# Patient Record
Sex: Male | Born: 1960
Health system: Southern US, Community
[De-identification: ages and names within clinical notes are randomized; demographics above are authoritative.]

## PROBLEM LIST (undated history)

## (undated) ENCOUNTER — Ambulatory Visit (HOSPITAL_COMMUNITY): Discharge status: Absent without leave | Payer: Self-pay

## (undated) DIAGNOSIS — E119 Type 2 diabetes mellitus without complications: Secondary | ICD-10-CM

## (undated) DIAGNOSIS — A539 Syphilis, unspecified: Secondary | ICD-10-CM

## (undated) DIAGNOSIS — I10 Essential (primary) hypertension: Secondary | ICD-10-CM

## (undated) HISTORY — PX: NECK SURGERY: SHX720

---

## 2006-03-11 ENCOUNTER — Emergency Department (HOSPITAL_COMMUNITY): Admission: EM | Admit: 2006-03-11 | Discharge: 2006-03-11 | Payer: Self-pay | Admitting: Emergency Medicine

## 2007-08-15 ENCOUNTER — Inpatient Hospital Stay (HOSPITAL_COMMUNITY): Admission: EM | Admit: 2007-08-15 | Discharge: 2007-08-17 | Payer: Self-pay | Admitting: Emergency Medicine

## 2007-08-15 ENCOUNTER — Encounter: Payer: Self-pay | Admitting: Emergency Medicine

## 2007-11-28 ENCOUNTER — Emergency Department (HOSPITAL_COMMUNITY): Admission: EM | Admit: 2007-11-28 | Discharge: 2007-11-28 | Payer: Self-pay | Admitting: Emergency Medicine

## 2007-12-26 ENCOUNTER — Emergency Department (HOSPITAL_COMMUNITY): Admission: EM | Admit: 2007-12-26 | Discharge: 2007-12-26 | Payer: Self-pay | Admitting: Family Medicine

## 2008-01-27 ENCOUNTER — Emergency Department (HOSPITAL_COMMUNITY): Admission: EM | Admit: 2008-01-27 | Discharge: 2008-01-27 | Payer: Self-pay | Admitting: Emergency Medicine

## 2008-02-03 ENCOUNTER — Encounter: Payer: Self-pay | Admitting: Family Medicine

## 2008-02-03 ENCOUNTER — Ambulatory Visit: Payer: Self-pay | Admitting: Family Medicine

## 2008-02-03 DIAGNOSIS — I10 Essential (primary) hypertension: Secondary | ICD-10-CM | POA: Insufficient documentation

## 2008-02-03 DIAGNOSIS — E669 Obesity, unspecified: Secondary | ICD-10-CM | POA: Insufficient documentation

## 2008-02-03 DIAGNOSIS — E1159 Type 2 diabetes mellitus with other circulatory complications: Secondary | ICD-10-CM

## 2008-02-03 DIAGNOSIS — I152 Hypertension secondary to endocrine disorders: Secondary | ICD-10-CM | POA: Insufficient documentation

## 2008-02-03 HISTORY — DX: Type 2 diabetes mellitus with other circulatory complications: E11.59

## 2008-02-05 LAB — CONVERTED CEMR LAB
BUN: 18 mg/dL (ref 6–23)
Calcium: 9 mg/dL (ref 8.4–10.5)
Chloride: 108 meq/L (ref 96–112)
Creatinine, Ser: 1.18 mg/dL (ref 0.40–1.50)

## 2008-03-23 ENCOUNTER — Ambulatory Visit: Payer: Self-pay | Admitting: Family Medicine

## 2008-04-06 ENCOUNTER — Ambulatory Visit (HOSPITAL_COMMUNITY): Admission: RE | Admit: 2008-04-06 | Discharge: 2008-04-06 | Payer: Self-pay | Admitting: Family Medicine

## 2008-04-06 ENCOUNTER — Ambulatory Visit: Payer: Self-pay | Admitting: Family Medicine

## 2008-04-06 ENCOUNTER — Emergency Department (HOSPITAL_COMMUNITY): Admission: EM | Admit: 2008-04-06 | Discharge: 2008-04-06 | Payer: Self-pay | Admitting: Emergency Medicine

## 2008-04-07 ENCOUNTER — Encounter: Payer: Self-pay | Admitting: Family Medicine

## 2008-04-20 ENCOUNTER — Ambulatory Visit: Payer: Self-pay | Admitting: Family Medicine

## 2008-04-20 ENCOUNTER — Encounter: Payer: Self-pay | Admitting: Family Medicine

## 2008-04-20 LAB — CONVERTED CEMR LAB
Calcium: 9 mg/dL (ref 8.4–10.5)
Chloride: 103 meq/L (ref 96–112)
Creatinine, Ser: 1.06 mg/dL (ref 0.40–1.50)
Sodium: 141 meq/L (ref 135–145)

## 2008-05-20 ENCOUNTER — Ambulatory Visit: Payer: Self-pay | Admitting: Family Medicine

## 2008-05-20 ENCOUNTER — Encounter: Payer: Self-pay | Admitting: Family Medicine

## 2008-07-08 ENCOUNTER — Ambulatory Visit: Payer: Self-pay | Admitting: Family Medicine

## 2008-08-05 ENCOUNTER — Telehealth: Payer: Self-pay | Admitting: Family Medicine

## 2008-08-14 ENCOUNTER — Ambulatory Visit: Payer: Self-pay | Admitting: Family Medicine

## 2008-09-17 ENCOUNTER — Encounter: Payer: Self-pay | Admitting: Family Medicine

## 2008-09-17 ENCOUNTER — Ambulatory Visit: Payer: Self-pay | Admitting: Family Medicine

## 2008-09-18 ENCOUNTER — Telehealth: Payer: Self-pay | Admitting: Family Medicine

## 2008-09-21 ENCOUNTER — Ambulatory Visit: Payer: Self-pay | Admitting: Family Medicine

## 2008-10-22 ENCOUNTER — Ambulatory Visit: Payer: Self-pay | Admitting: Family Medicine

## 2008-12-07 ENCOUNTER — Encounter: Payer: Self-pay | Admitting: Family Medicine

## 2008-12-08 ENCOUNTER — Encounter: Payer: Self-pay | Admitting: Family Medicine

## 2008-12-09 ENCOUNTER — Encounter: Payer: Self-pay | Admitting: Family Medicine

## 2008-12-09 ENCOUNTER — Ambulatory Visit: Payer: Self-pay | Admitting: Family Medicine

## 2008-12-09 DIAGNOSIS — G5620 Lesion of ulnar nerve, unspecified upper limb: Secondary | ICD-10-CM | POA: Insufficient documentation

## 2008-12-22 ENCOUNTER — Ambulatory Visit: Payer: Self-pay | Admitting: Family Medicine

## 2008-12-22 ENCOUNTER — Encounter: Payer: Self-pay | Admitting: Family Medicine

## 2008-12-22 DIAGNOSIS — K219 Gastro-esophageal reflux disease without esophagitis: Secondary | ICD-10-CM | POA: Insufficient documentation

## 2008-12-23 LAB — CONVERTED CEMR LAB
LDL Cholesterol: 105 mg/dL — ABNORMAL HIGH (ref 0–99)
Total CHOL/HDL Ratio: 3.8
Triglycerides: 107 mg/dL (ref <150)

## 2009-01-11 ENCOUNTER — Encounter: Payer: Self-pay | Admitting: *Deleted

## 2009-01-28 ENCOUNTER — Ambulatory Visit: Payer: Self-pay | Admitting: Family Medicine

## 2009-05-27 ENCOUNTER — Ambulatory Visit: Payer: Self-pay | Admitting: Family Medicine

## 2009-05-27 ENCOUNTER — Encounter: Payer: Self-pay | Admitting: Family Medicine

## 2009-07-02 ENCOUNTER — Ambulatory Visit: Payer: Self-pay | Admitting: Family Medicine

## 2009-07-02 DIAGNOSIS — R609 Edema, unspecified: Secondary | ICD-10-CM

## 2009-07-02 HISTORY — DX: Edema, unspecified: R60.9

## 2009-07-21 ENCOUNTER — Ambulatory Visit: Payer: Self-pay | Admitting: Family Medicine

## 2010-01-19 ENCOUNTER — Ambulatory Visit: Payer: Self-pay | Admitting: Family Medicine

## 2010-01-26 ENCOUNTER — Ambulatory Visit: Payer: Self-pay | Admitting: Family Medicine

## 2010-03-08 NOTE — Assessment & Plan Note (Signed)
 Summary: L arm numb x 2 days/[facility] pt   Vital Signs:  Patient profile:   50 year old male Height:      72 inches Weight:      220.31 pounds BMI:     29.99 Temp:     98.3 degrees F Pulse rate:   50 / minute BP sitting:   130 / 87  (right arm)  Vitals Entered By: Arland Morel (December 09, 2008 9:37 AM) CC: left arm numb x 2 days Is Patient Diabetic? No Pain Assessment Patient in pain? no        Primary Care Provider:  Alda Carpen, MD  CC:  left arm numb x 2 days.  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  Gradual onset, worse in the morning, does not radiate to or from neck, present only in hand, medial 2 fingers, and lower forearm.  Bending elbow sometimes worsens symptoms.  No injuries, does not bend elbow or rest on it at work, no episodes of saturday night palsy or ETOH use with sleeping on back of chair.  No numbness in other parts of body, no bowel/bladder incontinence.  Symptoms do not wake him up at night.  Habits & Providers  Alcohol-Tobacco-Diet     Tobacco Status: never  Allergies: 1)  Ace Inhibitors  Past History:  Past Medical History: Last updated: 2008-02-18 HTN  Past Surgical History: Last updated: 02-18-2008 None  Family History: Last updated: 2008/02/18 Father- HTN, deceased  Mother- healthy Brothers- HTN  Social History: Last updated: Feb 18, 2008 Lives alone [place].  Unemployed.  Walk but not intense exercise. Alcohol use-no Drug use-no Tobacco- no Single  Review of Systems       See HPI  Physical Exam  General:  Well-developed,well-nourished,in no acute distress; alert,appropriate and cooperative throughout examination Msk:  Inspection reveals normal musculature, symmetric, no scoliosis.  Spurlings test negative.  Strength 5/5 to all movements of neck, shoulders, elbows, and hand with preserved abd/adduction of fingers.  Sensation with light numbness of left  pinkie and medial aspect of ring finger. Tinels positive over epicondylar groove with paresthesias running down to fingers.  No palpable masses or suluxation of nerve.   Impression & Recommendations:  Problem # 1:  ULNAR NEUROPATHY, ELBOW (ICD-354.2) Assessment New Likely 2/2 stretch of nerve across epicondylar groove during sleep.  No braces available at [facility] to keep elbow at 60deg flexion or less.  Pt will try to keep it straight at night, if no improvement can refer to [facility].  Also printed out ulnar neuropathy stretches from sp med advisor, Rx vit B6 50mg  once daily x 3 months, ibuprofen 800mg  three times a day x 1 week.  Pt advised that this can take up to 2 mos to resolve.  Will RTC if no improvement in that time.  Can consider NCV/EMG at that time, if develops motor weakness should consider ortho referral for nerve mobilization.  Orders: [facility]- Est Level  3 (00786)  Complete Medication List: 1)  Metoprolol  Tartrate 100 Mg Tabs (Metoprolol  tartrate) .... One tablet by mouth two times a day 2)  Anacin 81 Mg Tbec (Aspirin ) .... One tablet by mouth daily 3)  Hydrochlorothiazide  25 Mg Tabs (Hydrochlorothiazide ) .... Take 1 tab by mouth every morning 4)  Amlodipine  Besylate 5 Mg Tabs (Amlodipine  besylate) .... One tablet by mouth daily 5)  Vitamin B-6 50 Mg Tabs (Pyridoxine hcl) .... One tab by mouth daily 6)  Ibuprofen 800  Mg Tabs (Ibuprofen) .... One tab by mouth three times a day x 1 week.  Patient Instructions: 1)  Great to meet you today, 2)  You have ulnar neuropathy.  Do the attached exercises DAILY, also take the vitamin B6 supplement daily, and ibuprofen 800mg  3x a day for a week.  This condition could take 2-3 months to get better.  Come back to see us  if no better in that time.  Also try to keep your elbow straight at night and avoid leaning on the left elbow. 3)  -Dr. ONEIDA. Prescriptions: IBUPROFEN 800 MG TABS (IBUPROFEN) One tab by mouth three times a day x 1 week.  #21 x 0   Entered  and Authorized by:   Debby Petties MD   Signed by:   Debby Petties MD on 12/09/2008   Method used:   Faxed to ...       [facility] (retail)       [address] [place], [place]  [address]       Ph: [phone]       Fax: [phone]   RxID:   [id number] VITAMIN B-6 50 MG TABS (PYRIDOXINE HCL) One tab by mouth daily  #90 x 0   Entered and Authorized by:   Debby Petties MD   Signed by:   Debby Petties MD on 12/09/2008   Method used:   Faxed to ...       [facility] (retail)       [address] [place], [place]  [address]       Ph: [phone]       Fax: [phone]   RxID:   [id number]

## 2010-03-08 NOTE — Assessment & Plan Note (Signed)
Summary: f/u HTN- worsened   Vital Signs:  Patient profile:   50 year old male Height:      72 inches Weight:      239.2 pounds BMI:     32.56 Temp:     98.0 degrees F oral Pulse rate:   62 / minute BP sitting:   157 / 98  (left arm) Cuff size:   large  Vitals Entered By: Gladstone Pih (July 21, 2009 9:02 AM) CC: swollen ankles Is Patient Diabetic? No Pain Assessment Patient in pain? no        Primary Care Provider:  Marisue Ivan, MD  CC:  swollen ankles.  History of Present Illness: 50yo M here for f/u HTN  HTN: Pt states that he was still taking the amlodipine despite instructions to stop it and reports continued swelling of lower extremities.  Has not taken the HCTZ in 1 month which he failed to report at the last visit. BP at home ranging b/w 120-160s/70-100s.  Was well controlled at last visit.  No dizziness, HA, CP, palpitations.  Habits & Providers  Alcohol-Tobacco-Diet     Tobacco Status: never  Current Medications (verified): 1)  Metoprolol Tartrate 100 Mg Tabs (Metoprolol Tartrate) .... One Tablet By Mouth Two Times A Day 2)  Anacin 81 Mg Tbec (Aspirin) .... One Tablet By Mouth Daily 3)  Hydrochlorothiazide 25 Mg  Tabs (Hydrochlorothiazide) .... Take 1 Tab By Mouth Every Morning  Allergies (verified): 1)  Ace Inhibitors 2)  Amlodipine Besylate  Review of Systems      See HPI  Physical Exam  General:  VS Reviewed. Well appearing, NAD.  Lungs:  Normal respiratory effort, chest expands symmetrically. Lungs are clear to auscultation, no crackles or wheezes. Heart:  Normal rate and regular rhythm. S1 and S2 normal without gallop, murmur, click, rub or other extra sounds. Extremities:  1+ edema of ankles and feet Neurologic:  no focal deficits   Impression & Recommendations:  Problem # 1:  HYPERTENSION (ICD-401.9) Assessment Deteriorated  Not at goal today. Poor compliance and understanding of instructions. I took away his amlodipine today.   Advised him to pick up the HCTZ.  Discussed importance of physical activity and dec salt intake. He acknowledge understanding.  He is to keep a log of his BP and f/u with Dr. Gwendolyn Grant in 2-3 months.  His updated medication list for this problem includes:    Metoprolol Tartrate 100 Mg Tabs (Metoprolol tartrate) ..... One tablet by mouth two times a day    Hydrochlorothiazide 25 Mg Tabs (Hydrochlorothiazide) .Marland Kitchen... Take 1 tab by mouth every morning  Orders: [facility]- Est Level  3 (95621)  Problem # 2:  EDEMA (ICD-782.3) Assessment: Deteriorated Concern that it is related to the amlodipine. Should improve with stopping the amlodipine and restarting HCTZ  His updated medication list for this problem includes:    Hydrochlorothiazide 25 Mg Tabs (Hydrochlorothiazide) .Marland Kitchen... Take 1 tab by mouth every morning  Complete Medication List: 1)  Metoprolol Tartrate 100 Mg Tabs (Metoprolol tartrate) .... One tablet by mouth two times a day 2)  Anacin 81 Mg Tbec (Aspirin) .... One tablet by mouth daily 3)  Hydrochlorothiazide 25 Mg Tabs (Hydrochlorothiazide) .... Take 1 tab by mouth every morning  Patient Instructions: 1)  Follow up with your new physician in the next 2-3 months to reassess your blood pressure and medications. 2)  If you have chest pain, palpitations, or passing out symptoms, call us.   Prescriptions: HYDROCHLOROTHIAZIDE 25 MG  TABS (HYDROCHLOROTHIAZIDE) Take 1 tab by mouth every morning  #90 x 1   Entered and Authorized by:   Marisue Ivan  MD   Signed by:   Marisue Ivan  MD on 07/21/2009   Method used:   Electronically to        [facility] [phone]* (retail)       [address]       [place], [place]  [address]       Ph: [phone]       Fax: [phone]   RxID:   [id number]

## 2010-03-08 NOTE — Assessment & Plan Note (Signed)
Summary: periphera edema-> d/c'd amlodipine, started losartan   Vital Signs:  Patient profile:   50 year old male Weight:      240.3 pounds Temp:     98.4 degrees F oral Pulse rate:   67 / minute Pulse rhythm:   regular BP sitting:   166 / 126  (right arm) Cuff size:   large  Vitals Entered By: Loralee Pacas CMA (May 27, 2009 9:10 AM)  Serial Vital Signs/Assessments:  Time      Position  BP       Pulse  Resp  Temp     By 9:38 AM             148/102                        Loralee Pacas CMA   Primary Care Provider:  Marisue Ivan, MD  CC:  swelling LE.  History of Present Illness: 50yo M c/o LE swelling  LE swelling: x 1 month.  Admits to foods with high salt content.  No SOB.    HTN: Reports LE swelling.  Not checking it regularly.  Was well controlled at last visit.  No dizziness, CP, palpitations.    Preventative: Not taking ASA.    Current Medications (verified): 1)  Metoprolol Tartrate 100 Mg Tabs (Metoprolol Tartrate) .... One Tablet By Mouth Two Times A Day 2)  Anacin 81 Mg Tbec (Aspirin) .... One Tablet By Mouth Daily 3)  Hydrochlorothiazide 25 Mg  Tabs (Hydrochlorothiazide) .... Take 1 Tab By Mouth Every Morning 4)  Nitrostat 0.4 Mg Subl (Nitroglycerin) .Marland Kitchen.. 1 Underneath The Tongue As Needed With Chest Pain Call Me If You Use This and It Gets Better 5)  Losartan Potassium 50 Mg Tabs (Losartan Potassium) .Marland Kitchen.. 1 Tab By Mouth Daily  Allergies: 1)  Ace Inhibitors 2)  Amlodipine Besylate  Review of Systems      See HPI  Physical Exam  General:  VS Reviewed. Well appearing, NAD.  Neck:  no JVD Lungs:  Normal respiratory effort, chest expands symmetrically. Lungs are clear to auscultation, no crackles or wheezes. Heart:  Normal rate and regular rhythm. S1 and S2 normal without gallop, murmur, click, rub or other extra sounds. Abdomen:  Soft, NT, ND, no HSM, active BS  Extremities:  1+ edema LE Neurologic:  no focal deficits   Impression &  Recommendations:  Problem # 1:  HYPERTENSION (ICD-401.9) Assessment Deteriorated Not at goal (<140/90). Likely adverse rxn to amlodipine -> peripheral edema. Plan to stop the amlodipine, start losartan 50mg  and f/u in 1 month to reassess. If he can tolerate the amlodipine, plan to change to combo med w/ losartan and HCTZ. Will check BMET to assess renal fxn and K.    The following medications were removed from the medication list:    Amlodipine Besylate 10 Mg Tabs (Amlodipine besylate) .Marland Kitchen... 1 tablet by mouth daily for high blood pressure His updated medication list for this problem includes:    Metoprolol Tartrate 100 Mg Tabs (Metoprolol tartrate) ..... One tablet by mouth two times a day    Hydrochlorothiazide 25 Mg Tabs (Hydrochlorothiazide) .Marland Kitchen... Take 1 tab by mouth every morning    Losartan Potassium 50 Mg Tabs (Losartan potassium) .Marland Kitchen... 1 tab by mouth daily  Orders: [facility]Basic Metabolic Panel [phone]) [facility]- Est Level  3 (95621)  Complete Medication List: 1)  Metoprolol Tartrate 100 Mg Tabs (Metoprolol tartrate) .... One tablet by mouth two times a day  2)  Anacin 81 Mg Tbec (Aspirin) .... One tablet by mouth daily 3)  Hydrochlorothiazide 25 Mg Tabs (Hydrochlorothiazide) .... Take 1 tab by mouth every morning 4)  Nitrostat 0.4 Mg Subl (Nitroglycerin) .Marland Kitchen.. 1 underneath the tongue as needed with chest pain call me if you use this and it gets better 5)  Losartan Potassium 50 Mg Tabs (Losartan potassium) .Marland Kitchen.. 1 tab by mouth daily  Patient Instructions: 1)  Please schedule a follow-up appointment in 1 month.  2)  I started you on a new medication called Losartan for your blood pressure. 3)  Stop taking the amlodipine. Prescriptions: HYDROCHLOROTHIAZIDE 25 MG  TABS (HYDROCHLOROTHIAZIDE) Take 1 tab by mouth every morning  #30 x 1   Entered and Authorized by:   Marisue Ivan  MD   Signed by:   Marisue Ivan  MD on 05/27/2009   Method used:   Electronically to        [facility] [phone]* (retail)       [address]       [place], [place]  [address]       Ph: [phone]       Fax: [phone]   RxID:   [id number] METOPROLOL TARTRATE 100 MG TABS (METOPROLOL TARTRATE) one tablet by mouth two times a day  #180 x 1   Entered and Authorized by:   Marisue Ivan  MD   Signed by:   Marisue Ivan  MD on 05/27/2009   Method used:   Electronically to        [facility] [address] [phone]* (retail)       [address]       [place], [place]  [address]       Ph: [phone]       Fax: [phone]   RxID:   [id number] LOSARTAN POTASSIUM 50 MG TABS (LOSARTAN POTASSIUM) 1 tab by mouth daily  #30 x 1   Entered and Authorized by:   Marisue Ivan  MD   Signed by:   Marisue Ivan  MD on 05/27/2009   Method used:   Electronically to        [facility] [phone]* (retail)       [address]       [place], [place]  [address]       Ph: [phone]       Fax: [phone]   RxID:   [id number]    Prevention & Chronic Care Immunizations   Influenza vaccine: refused  (02/03/2008)   Influenza vaccine due: 02/02/2009    Tetanus booster: 02/03/2008: given   Tetanus booster due: 02/02/2018    Pneumococcal vaccine: Not documented  Other Screening   PSA: Not documented   Smoking status: never  (12/22/2008)  Lipids   Total Cholesterol: 171  (12/22/2008)   LDL: 105  (12/22/2008)   LDL Direct: Not documented   HDL: 45  (12/22/2008)   Triglycerides: 107  (12/22/2008)  Hypertension   Last Blood Pressure: 166 / 126  (05/27/2009)   Serum creatinine: 1.06  (04/20/2008)   BMP action: Ordered   Serum potassium 4.2  (04/20/2008)    Hypertension flowsheet reviewed?: Yes   Progress toward BP goal: Deteriorated  Self-Management Support :   Personal Goals (by the next clinic visit) :      Personal blood pressure goal: 140/90  (10/22/2008)   Patient will work on the following items until the next clinic visit to reach self-care  goals:  Medications and monitoring: take my medicines every day, check my blood pressure, bring all of my medications to every visit  (05/27/2009)     Eating: drink diet soda or water instead of juice or soda, eat more vegetables, use fresh or frozen vegetables, eat foods that are low in salt, eat baked foods instead of fried foods, eat fruit for snacks and desserts, limit or avoid alcohol  (05/27/2009)     Activity: take a 30 minute walk every day  (12/22/2008)    Hypertension self-management support: BP self-monitoring log, Written self-care plan, Education handout  (05/27/2009)   Hypertension self-care plan printed.   Hypertension education handout printed

## 2010-03-08 NOTE — Miscellaneous (Signed)
Summary: walk in  Clinical Lists Changes came in due to swollen legs & feet. has started a new job 2 weeks ago that is mostly sedentary. Non-pitting, skin is tight on lower legs. I did not have him remove shoes. BP is 170-104 p. 65. has been out of one of his HTN meds x 4 days. needs refill on another. has a few days left. c/o intermittant eye pain. symptoms started about 1 month ago. he was concerned about CVA. reviewed the signs with him & advised calling the drug store 3-4 days before he runs out of meds to avoid skipping doses. placed in work in.[address] [address] RN  May 27, 2009 9:10 AM  Pt to be placed in my clinic schedule today......Marland KitchenMarisue Ivan, MD

## 2010-03-08 NOTE — Assessment & Plan Note (Signed)
Summary: f/u htn and edema   Vital Signs:  Patient profile:   50 year old male Height:      72 inches Weight:      240.7 pounds BMI:     32.76 Temp:     98.4 degrees F oral Pulse rate:   64 / minute BP sitting:   128 / 82  (left arm) Cuff size:   large  Vitals Entered By: Gladstone Pih (Jul 02, 2009 10:56 AM) CC: f/u of HTN Is Patient Diabetic? No Pain Assessment Patient in pain? no      Comments just getting over being sick, did not start on New HTN medication due to cost   Primary Care Provider:  Marisue Ivan, MD  CC:  f/u of HTN.  History of Present Illness: 50yo M here for f/u HTN  HTN: No adverse effects from medication but was unable to pick up the losartan due to cost.  States that he is walking and jogging more.   Checking it occasionally ranging in the 120s-150s.  Was close to goal at last visit.  No dizziness, HA, CP, palpitations.  Still has LE edema worse in the left ankle (overall better than prior visit).   Habits & Providers  Alcohol-Tobacco-Diet     Tobacco Status: never  Current Medications (verified): 1)  Metoprolol Tartrate 100 Mg Tabs (Metoprolol Tartrate) .... One Tablet By Mouth Two Times A Day 2)  Anacin 81 Mg Tbec (Aspirin) .... One Tablet By Mouth Daily 3)  Hydrochlorothiazide 25 Mg  Tabs (Hydrochlorothiazide) .... Take 1 Tab By Mouth Every Morning 4)  Nitrostat 0.4 Mg Subl (Nitroglycerin) .Marland Kitchen.. 1 Underneath The Tongue As Needed With Chest Pain Call Me If You Use This and It Gets Better  Allergies (verified): 1)  Ace Inhibitors 2)  Amlodipine Besylate  Review of Systems      See HPI  Physical Exam  General:  VS Reviewed. Well appearing, NAD.  Lungs:  Normal respiratory effort, chest expands symmetrically. Lungs are clear to auscultation, no crackles or wheezes. Heart:  Normal rate and regular rhythm. S1 and S2 normal without gallop, murmur, click, rub or other extra sounds. Extremities:  1+ edema LE 2+ edema of Left ankle; no  ttp; full ROM   Impression & Recommendations:  Problem # 1:  HYPERTENSION (ICD-401.9) Assessment Improved  At goal (<140/90). Seems to be improved with increased physical activity.  Will d/c the losartan b/c he cannot afford it.  He is to keep a log of his BP now that he has a BP cuff/monitor.  The following medications were removed from the medication list:    Losartan Potassium 50 Mg Tabs (Losartan potassium) .Marland Kitchen... 1 tab by mouth daily His updated medication list for this problem includes:    Metoprolol Tartrate 100 Mg Tabs (Metoprolol tartrate) ..... One tablet by mouth two times a day    Hydrochlorothiazide 25 Mg Tabs (Hydrochlorothiazide) .Marland Kitchen... Take 1 tab by mouth every morning  Orders: [facility]- Est Level  3 (27253)  Problem # 2:  EDEMA (ICD-782.3) Assessment: Improved  Slightly improved LE edema since stopping the amlodipine. Provided compression stockings. f/u in 3 weeks per pt request.  His updated medication list for this problem includes:    Hydrochlorothiazide 25 Mg Tabs (Hydrochlorothiazide) .Marland Kitchen... Take 1 tab by mouth every morning  Orders: [facility]- Est Level  3 (66440)  Complete Medication List: 1)  Metoprolol Tartrate 100 Mg Tabs (Metoprolol tartrate) .... One tablet by mouth two times a day 2)  Anacin 81 Mg Tbec (Aspirin) .... One tablet by mouth daily 3)  Hydrochlorothiazide 25 Mg Tabs (Hydrochlorothiazide) .... Take 1 tab by mouth every morning 4)  Nitrostat 0.4 Mg Subl (Nitroglycerin) .Marland Kitchen.. 1 underneath the tongue as needed with chest pain call me if you use this and it gets better  Patient Instructions: 1)  Schedule f/u appt in 3 weeks. 2)  Keep a log of your blood pressure. 3)  I'm giving you a prescription for compression stockings to help with the swelling. 4)  Don't worry about the losartan.   Prevention & Chronic Care Immunizations   Influenza vaccine: refused  (02/03/2008)   Influenza vaccine due: 02/02/2009    Tetanus booster: 02/03/2008: given    Tetanus booster due: 02/02/2018    Pneumococcal vaccine: Not documented  Other Screening   PSA: Not documented   Smoking status: never  (07/02/2009)  Lipids   Total Cholesterol: 171  (12/22/2008)   LDL: 105  (12/22/2008)   LDL Direct: Not documented   HDL: 45  (12/22/2008)   Triglycerides: 107  (12/22/2008)  Hypertension   Last Blood Pressure: 128 / 82  (07/02/2009)   Serum creatinine: 1.06  (04/20/2008)   BMP action: Ordered   Serum potassium 4.2  (04/20/2008)    Hypertension flowsheet reviewed?: Yes   Progress toward BP goal: At goal  Self-Management Support :   Personal Goals (by the next clinic visit) :      Personal blood pressure goal: 140/90  (10/22/2008)   Hypertension self-management support: BP self-monitoring log, Written self-care plan, Education handout  (05/27/2009)

## 2010-03-10 NOTE — Assessment & Plan Note (Signed)
Summary: F/U  KH   Vital Signs:  Patient profile:   50 year old male Height:      72 inches Weight:      254 pounds BMI:     34.57 Temp:     98.7 degrees F oral Pulse rate:   71 / minute BP sitting:   160 / 94  (left arm) Cuff size:   large  Vitals Entered By: Garen Grams LPN (January 26, 2010 10:17 AM) CC: f/u bp Is Patient Diabetic? No Pain Assessment Patient in pain? no        Primary Care Provider:  Bobby Rumpf  MD  CC:  f/u bp.  History of Present Illness: 1) HTN: Last seen on 12/14. Had run out of BP meds 5 days prior to being seen. Was here to have medications refilled - had been taking his medications inconsistently.  Pressure was noted to be 180's / 120's; patient also reported mild to moderate headache at that time (headache has since resolved with Tylenol on that day). Restarted on medications, also given prescription for metoprolol - patient reportst hat he did not pick this up because he was told it would be $50 at [facility] (though it is on the $4 list at [facility]). Was checking BPs occasionally at home - noted sytolics to be in 190-200 range at times and that BP was consistently worse with salty or fried foods. Sedentary.   2) Obesity: Weight 254 lbs. Peak weight since being seen here. Sedentary. Would eat fried foods / fast food almost every day - has cut back since his last visit with me.      Denies chest pain, dyspnea, urinary changes, vision change, nausea, emesis, neck stiffness, focal neurological signs, headache  Med rec as below except for metoprolol which he has not picked up.   Habits & Providers  Alcohol-Tobacco-Diet     Tobacco Status: never  Allergies: 1)  Ace Inhibitors 2)  Amlodipine Besylate  Family History: Reviewed history from 02/03/2008 and no changes required. Father- HTN, deceased  Mother- healthy Brothers- HTN  Social History: Alcohol use-no Drug use-no Tobacco- no Single  Physical Exam  Neck:  no JVD  Lungs:   normal respiratory effort, no intercostal retractions, no accessory muscle use, and normal breath sounds.   Heart:  normal rate, regular rhythm, no murmur, no gallop, no JVD, and PMI normal.   Pulses:  2+ radials  Extremities:  no edema    Impression & Recommendations:  Problem # 1:  HYPERTENSION (ICD-401.9) Assessment Unchanged  Not at goal 140/90. Advised to start taking metorpolol as prescribed. Advised regarding exercise and DASH - counseled on this for 25 minutes.  His updated medication list for this problem includes:    Metoprolol Tartrate 100 Mg Tabs (Metoprolol tartrate) ..... One tablet by mouth two times a day    Hydrochlorothiazide 25 Mg Tabs (Hydrochlorothiazide) .Marland Kitchen... Take 1 tab by mouth every morning    Losartan Potassium 50 Mg Tabs (Losartan potassium) ..... One tab by mouth qday  BP today: 160/94 Prior BP: 183/124 (01/19/2010)  Prior 10 Yr Risk Heart Disease: 7 % (02/03/2008)  Labs Reviewed: K+: 4.2 (04/20/2008) Creat: : 1.06 (04/20/2008)   Chol: 171 (12/22/2008)   HDL: 45 (12/22/2008)   LDL: 105 (12/22/2008)   TG: 107 (12/22/2008)  Orders: [facility]- Est  Level 4 (16109)  Problem # 2:  OBESITY (ICD-278.00) Assessment: Deteriorated  Counseled on DASH diet, exercise for 25 minutes. Patient agrees to start walking 30 minutes  a day 3 times a week and to cut back on fast food to once a week. Will follow.   Orders: [facility]- Est  Level 4 (16109)  Complete Medication List: 1)  Metoprolol Tartrate 100 Mg Tabs (Metoprolol tartrate) .... One tablet by mouth two times a day 2)  Anacin 81 Mg Tbec (Aspirin) .... One tablet by mouth daily 3)  Hydrochlorothiazide 25 Mg Tabs (Hydrochlorothiazide) .... Take 1 tab by mouth every morning 4)  Losartan Potassium 50 Mg Tabs (Losartan potassium) .... One tab by mouth qday   Orders Added: 1)  [facility]- Est  Level 4 [60454]

## 2010-03-10 NOTE — Assessment & Plan Note (Signed)
Summary: elevated BP and headache/ls  patient walks in office today wanting refill on HCTZ and metoprolol.  last office visit was 07/2009.  states he has been without meds for 5 days. states he has a severe headache and patient appears in much distress .  advised he will need to be seen . Dr. Wallene Huh will see him now.  Theresia Lo RN  January 19, 2010 9:13 AM   Vital Signs:  Patient profile:   50 year old male Height:      72 inches Temp:     98.4 degrees F Pulse rate:   83 / minute BP sitting:   183 / 124  (left arm) Cuff size:   large  Vitals Entered By: Garen Grams LPN (January 19, 2010 8:58 AM) CC: migraine, elevated BP Is Patient Diabetic? No Pain Assessment Patient in pain? yes     Location: headache   Primary Care Provider:  Bobby Rumpf  MD  CC:  migraine and elevated BP.  History of Present Illness: 1) HTN: Ran out of BP meds 5 days ago. Here to have medications refilled. Does not always take his medications everyday. Checks BPs occasionally at home - notes sytolics to be in 190-200 range at times. BP is worse when he eats salty or fried foods - he reports that he can eat a whole bag of potato chips in one sitting. Does not exercise. Denies chest pain, dyspnea, urinary changes. Reports headache today - usually has headache if blood pressure is high. Headache is mild to moderate, worse with movement, occipital. Has not taken anything for headache. Denies vision change, nausea, emesis, neck stiffness, focal neurological signs.   Med rec as below (ran out of meds 5 days ago)   Habits & Providers  Alcohol-Tobacco-Diet     Tobacco Status: never  Current Medications (verified): 1)  Metoprolol Tartrate 100 Mg Tabs (Metoprolol Tartrate) .... One Tablet By Mouth Two Times A Day 2)  Anacin 81 Mg Tbec (Aspirin) .... One Tablet By Mouth Daily 3)  Hydrochlorothiazide 25 Mg  Tabs (Hydrochlorothiazide) .... Take 1 Tab By Mouth Every Morning 4)  Losartan Potassium 50 Mg Tabs  (Losartan Potassium) .... One Tab By Mouth Qday  Allergies (verified): 1)  Ace Inhibitors 2)  Amlodipine Besylate  Physical Exam  General:  obese, NAD, vitals reviewed, hypertensive  Eyes:  pupils equal, round and reactive to light, extraoccular movements intact, no papilledema on limited funduscopy  Neck:  no JVD  Lungs:  normal respiratory effort, no intercostal retractions, no accessory muscle use, and normal breath sounds.   Heart:  normal rate, regular rhythm, no murmur, no gallop, no JVD, and PMI normal.   Pulses:  2+ radials  Extremities:  no edema  Neurologic:  alert & oriented X3, cranial nerves II-XII intact, strength normal in all extremities, gait normal, and DTRs symmetrical and normal.     Impression & Recommendations:  Problem # 1:  HYPERTENSION (ICD-401.9) Assessment Deteriorated  Restart medications. No focal neurological signs, no signs of increased ICP on exam w/ headache - unlikely hypertensive emergency. Tylenol for headache.  No other symptoms of end organ damage. Will restart BP meds today, add losartan, have patient follow up in one week with me. Patient requests having me as his new PCP (has not seen Dr. Gwendolyn Grant before). I have arranged this. Advised to restrict salt. Advised on DASH diet. CHECK BMET for Cr at next visit, also check TSH, lipid panel.   His updated medication list for  this problem includes:    Metoprolol Tartrate 100 Mg Tabs (Metoprolol tartrate) ..... One tablet by mouth two times a day    Hydrochlorothiazide 25 Mg Tabs (Hydrochlorothiazide) .Marland Kitchen... Take 1 tab by mouth every morning    Losartan Potassium 50 Mg Tabs (Losartan potassium) ..... One tab by mouth qday  BP today: 183/124 Prior BP: 157/98 (07/21/2009)  Prior 10 Yr Risk Heart Disease: 7 % (02/03/2008)  Labs Reviewed: K+: 4.2 (04/20/2008) Creat: : 1.06 (04/20/2008)   Chol: 171 (12/22/2008)   HDL: 45 (12/22/2008)   LDL: 105 (12/22/2008)   TG: 107 (12/22/2008)  Orders: [facility]- Est  Level  3 (16109)  Complete Medication List: 1)  Metoprolol Tartrate 100 Mg Tabs (Metoprolol tartrate) .... One tablet by mouth two times a day 2)  Anacin 81 Mg Tbec (Aspirin) .... One tablet by mouth daily 3)  Hydrochlorothiazide 25 Mg Tabs (Hydrochlorothiazide) .... Take 1 tab by mouth every morning 4)  Losartan Potassium 50 Mg Tabs (Losartan potassium) .... One tab by mouth qday  Patient Instructions: 1)  It was great to see you today!  2)  Take the new medication for blood pressure if you are able to afford it (Losartan) 3)  Take your other blood pressure medications as before. 4)  Stay away from fast food, salty foods and do not add salt to foods you cook 5)  Eat fresh or frozen fruits and vegetables when you can (try to get at least 5-6 servings per day. 6)  Take Tylenol for headache - if you notice your headache gets worse or you have chest pain or weakness / numbness anywhere give [place] a call.  7)  Come back to see me in one week - let them know up front that you are requesting a change of physician to me.  Prescriptions: METOPROLOL TARTRATE 100 MG TABS (METOPROLOL TARTRATE) one tablet by mouth two times a day  #180 x 1   Entered and Authorized by:   Bobby Rumpf  MD   Signed by:   Bobby Rumpf  MD on 01/19/2010   Method used:   Electronically to        [facility] [phone]* (retail)       [address]       [place], [place]  [address]       Ph: [phone]       Fax: [phone]   RxID:   [id number] HYDROCHLOROTHIAZIDE 25 MG  TABS (HYDROCHLOROTHIAZIDE) Take 1 tab by mouth every morning  #90 x 1   Entered and Authorized by:   Bobby Rumpf  MD   Signed by:   Bobby Rumpf  MD on 01/19/2010   Method used:   Electronically to        [facility] [phone]* (retail)       [address]       [place], [place]  [address]       Ph: [phone]       Fax: [phone]   RxID:   [id number] LOSARTAN POTASSIUM 50 MG TABS (LOSARTAN POTASSIUM) one tab by mouth qday  #30 x  0   Entered and Authorized by:   Bobby Rumpf  MD   Signed by:   Bobby Rumpf  MD on 01/19/2010   Method used:   Print then Give to Patient   RxID:   [id number]    Orders Added: 1)  [facility]- Est Level  3 [27062]

## 2010-05-31 ENCOUNTER — Ambulatory Visit (INDEPENDENT_AMBULATORY_CARE_PROVIDER_SITE_OTHER): Payer: Self-pay | Admitting: Family Medicine

## 2010-05-31 ENCOUNTER — Encounter: Payer: Self-pay | Admitting: Family Medicine

## 2010-05-31 DIAGNOSIS — R7301 Impaired fasting glucose: Secondary | ICD-10-CM

## 2010-05-31 DIAGNOSIS — E669 Obesity, unspecified: Secondary | ICD-10-CM

## 2010-05-31 DIAGNOSIS — I10 Essential (primary) hypertension: Secondary | ICD-10-CM

## 2010-05-31 HISTORY — DX: Impaired fasting glucose: R73.01

## 2010-05-31 MED ORDER — HYDROCHLOROTHIAZIDE 25 MG PO TABS: 25.0000 mg | ORAL_TABLET | Freq: Every day | ORAL | Status: DC

## 2010-05-31 MED ORDER — METOPROLOL TARTRATE 100 MG PO TABS: 100.0000 mg | ORAL_TABLET | Freq: Two times a day (BID) | ORAL | Status: DC

## 2010-05-31 NOTE — Progress Notes (Signed)
  Subjective:    Patient ID: Nicholas Case, male    DOB: Apr 21, 1960, 50 y.o.   MRN: [id number]  HPI  1) HTN: Last seen on 01/26/10. Has been out of his blood pressure medications for at least one month - has been unable to afford due to the fact that he was laid off several months ago and can only find part time work. Blood pressure at home has been as high as 229 / 145 - he has been in the 200's / 100's range since he ran out of his medications. Reports mild headache today relieved by ibuprofen 800 mg - which he has been taking intermittently for headache. Has been monitoring salt intake (though looking at percentages instead of total mg sodium) and has started walking daily for exercise. Has not eaten fast food in several months. Reports headache as above, along with some blurry vision. Denies chest pain, dyspnea, LE edema.   2) Obesity: Weight 254 lbs at peak in December 2011. Weight now down to 239 lbs. Increased activity with daily walking. Has eliminated fast foods.   3) Elevated fasting glucose: Noted on fasting labs from 2010. Reports some increased thirst and increased urination, occasional blurry vision as above.  Reviewed pertinent past medical history as above.   Review of Systems As per HPI     Objective:   Physical Exam  Constitutional: He is oriented to person, place, and time. He appears well-developed and well-nourished. No distress.  Eyes: EOM are normal. Pupils are equal, round, and reactive to light.  Fundoscopic exam:      The right eye shows no arteriolar narrowing, no AV nicking, no exudate, no hemorrhage and no papilledema.       The left eye shows no arteriolar narrowing, no AV nicking, no exudate, no hemorrhage and no papilledema.  Neck: No JVD present. Carotid bruit is not present. No mass and no thyromegaly present.  Cardiovascular: Normal rate, regular rhythm, S1 normal, S2 normal, normal heart sounds, intact distal pulses and normal pulses.  PMI is not displaced.    Pulmonary/Chest: Effort normal and breath sounds normal. No respiratory distress.  Neurological: He is alert and oriented to person, place, and time. He has normal strength. No cranial nerve deficit.          Assessment & Plan:

## 2010-05-31 NOTE — Patient Instructions (Addendum)
Follow up in two weeks  Go to [facility] to get assistance with your medications Keep your SODIUM below 1500 mg daily (add it up over the course of a day) Continue to walk for exercise If you notice chest pain, shortness of breath vision problems, come back in sooner.

## 2010-06-01 NOTE — Assessment & Plan Note (Addendum)
Uncontrolled secondary to non-adherence to medications due to inability to afford. Information for Medication Assistance Program given. Patient states that he will be able to get a friend to help him pay for his medications for this month. Refilled medications. Advised regarding DASH diet - handout given. Follow up in two weeks. No signs of hypertensive emergency on history or exam. Reviewed red flags. Encouraged continued exercise and salt restriction. (1500 mg daily)

## 2010-06-01 NOTE — Assessment & Plan Note (Signed)
Refused labs. Unable to assess as a result. Follow up in two weeks. Advised regarding exercise and dietary modification - total appointment time = 25 minutes.

## 2010-06-01 NOTE — Assessment & Plan Note (Signed)
Advised regarding continued exercise and dietary modification. Congratulated on weight loss. Follow up two weeks. Total appointment time = 25 minutes.

## 2010-06-15 ENCOUNTER — Ambulatory Visit (INDEPENDENT_AMBULATORY_CARE_PROVIDER_SITE_OTHER): Payer: Self-pay | Admitting: Family Medicine

## 2010-06-15 ENCOUNTER — Encounter: Payer: Self-pay | Admitting: Family Medicine

## 2010-06-15 ENCOUNTER — Other Ambulatory Visit: Payer: Self-pay | Admitting: *Deleted

## 2010-06-15 DIAGNOSIS — I10 Essential (primary) hypertension: Secondary | ICD-10-CM

## 2010-06-15 DIAGNOSIS — R7301 Impaired fasting glucose: Secondary | ICD-10-CM

## 2010-06-15 DIAGNOSIS — E669 Obesity, unspecified: Secondary | ICD-10-CM

## 2010-06-15 LAB — COMPREHENSIVE METABOLIC PANEL
ALT: 24 U/L (ref 0–53)
Albumin: 4.2 g/dL (ref 3.5–5.2)
BUN: 13 mg/dL (ref 6–23)
CO2: 22 mEq/L (ref 19–32)
Creat: 1.01 mg/dL (ref 0.40–1.50)
Potassium: 3.9 mEq/L (ref 3.5–5.3)
Sodium: 141 mEq/L (ref 135–145)
Total Bilirubin: 0.3 mg/dL (ref 0.3–1.2)
Total Protein: 7.2 g/dL (ref 6.0–8.3)

## 2010-06-15 LAB — POCT GLYCOSYLATED HEMOGLOBIN (HGB A1C): Hemoglobin A1C: 6.1

## 2010-06-15 LAB — LIPID PANEL
LDL Cholesterol: 126 mg/dL — ABNORMAL HIGH (ref 0–99)
Triglycerides: 161 mg/dL — ABNORMAL HIGH (ref <150)
VLDL: 32 mg/dL (ref 0–40)

## 2010-06-15 MED ORDER — LOSARTAN POTASSIUM 50 MG PO TABS: 50.0000 mg | ORAL_TABLET | Freq: Every day | ORAL | Status: DC

## 2010-06-15 NOTE — Assessment & Plan Note (Signed)
Still uncontrolled but much improved on medications. Will add Cozaar as he can afford this today, advised to continue with  Medication Assistance Program given.Advised regarding DASH diet - handout given. Follow up 6 weeks. No signs of hypertensive emergency on history or exam. Reviewed red flags. Encouraged continued exercise and salt restriction. (1500 mg daily)

## 2010-06-15 NOTE — Progress Notes (Signed)
  Subjective:    Patient ID: Nicholas Case, male    DOB: July 27, 1960, 50 y.o.   MRN: [id number]  HPI  1) HTN: Last seen on 05/31/10. Has been taking his HCTZ and metoprolol since then after having been out of his blood pressure medications for at least one month - had been unable to afford due to the fact that he was laid off several months ago and can only find part time work. Blood pressure at home had been as high as 229 / 145 - he has been in the 200's / 100's range since he ran out of his medications. Reports mild headache today - had previously been taking ibuprofen 800 mg for headaches but has since stopped. Has been monitoring salt intake but has stopped walking daily for exercise. Has not eaten fast food in several months. Reports headache as above, along with some blurry vision. Denies chest pain, dyspnea, LE edema.   2) Obesity: Weight 254 lbs at peak in December 2011. Weight now 240 lbs; unfortunately he has stopped walking for exercise. Has eliminated fast foods.   3) Elevated fasting glucose: Noted on fasting labs from 2010. Reports some increased thirst and increased urination, occasional blurry vision as above. Refused labs at last visit.  Pertinent past medical history reviewed  Review of Systems As per HPI     Objective:   Physical Exam Constitutional: He is oriented to person, place, and time. He appears well-developed and well-nourished. No distress.  Eyes: EOM are normal. Pupils are equal, round, and reactive to light.  Fundoscopic exam:      The right eye shows no arteriolar narrowing, no AV nicking, no exudate, no hemorrhage and no papilledema.       The left eye shows no arteriolar narrowing, no AV nicking, no exudate, no hemorrhage and no papilledema.  Neck: No JVD present. Carotid bruit is not present. No mass and no thyromegaly present.  Cardiovascular: Normal rate, regular rhythm, S1 normal, S2 normal, normal heart sounds, intact distal pulses and normal pulses.  PMI  is not displaced.   Pulmonary/Chest: Effort normal and breath sounds normal. No respiratory distress.  Neurological: He is alert and oriented to person, place, and time. He has normal strength. No cranial nerve deficit.        Assessment & Plan:

## 2010-06-15 NOTE — Assessment & Plan Note (Signed)
Check A1C today. Follow up in 6 weeks. Advised regarding exercise and dietary modification - total appointment time = 25 minutes.

## 2010-06-15 NOTE — Assessment & Plan Note (Signed)
Advised regarding continued exercise and dietary modification. Congratulated on maintaining weight. Follow up 6 weeks. Total appointment time = 25 minutes.

## 2010-06-15 NOTE — Patient Instructions (Addendum)
Follow up with me in late June.  Take your Cozaar as directed to help blood pressure Try to increase your walking I will let you know your lab results.   - Dr. Wallene Huh

## 2010-06-17 ENCOUNTER — Encounter: Payer: Self-pay | Admitting: Family Medicine

## 2010-06-21 NOTE — H&P (Signed)
NAME:  RIGHTEOUS, CLAIBORNE               ACCOUNT NO.:  [id number]   MEDICAL RECORD NO.:  [id number]          PATIENT TYPE:  INP   LOCATION:  0106                         FACILITY:  [facility]   PHYSICIAN:  Mobolaji B. Bakare, M.D.DATE OF BIRTH:  1960/10/13   DATE OF ADMISSION:  08/15/2007  DATE OF DISCHARGE:                              HISTORY & PHYSICAL   PRIMARY CARE PHYSICIAN:  Unassigned.   CHIEF COMPLAINT:  High blood pressure.   HISTORY OF PRESENTING COMPLAINT:  Mr. Danford is a pleasant 50 year old  African American male with known history of high blood pressure.  He  went to [facility] today because of concern about  sexually transmitted disease.  He was given some medications for these.  In addition, the patient was noted to have high blood pressure.  Hence,  he was sent over to the emergency room for further evaluation.  Initial  blood pressure upon arrival was 196/122 with a heart rate of 83.  He had  multiple blood pressure checks here in the emergency room.  The maximum  in the emergency room after admission was 222/161.  The patient has  received labetalol 20 mg x3 doses.  He is currently on nitroprusside  infusion.  Blood pressure is 168/180.   He had complained of headaches upon arrival and has been having  headaches for a couple of days.  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  He was advised to follow up with a physician  at that time, but he has consistently refused because of the high cost  of health care which he cannot afford.  He has made several insinuations  to leave the emergency room against medical advice today, but the  patient has been persuaded to stay.  He is really concerned about the  cost of this hospitalization.   He denies blurred vision.  No chest pain, dyspnea on exertion.  He tries  to exercise every day.   REVIEW OF SYSTEMS:  No cough,  orthopnea, PND, lower extremity edema.  No  abdominal pain, fevers, chills.  No dysuria or straining at micturition.   PAST MEDICAL AND SURGICAL HISTORY:  Hypertension for which he has not  sought any medical treatment.   MEDICATIONS:  None except for 3 pills that were given to him today for  the concern about sexually transmitted disease.  Names unknown.   ALLERGIES:  No known drug allergies.   FAMILY HISTORY:  Father passed away at a very young age of 73.  He had  hypertension.  He had bilateral lower extremity amputation.  The patient  cannot really give much of the details.  Mother is well.  He has one  brother with hypertension.  No known family history of coronary artery  disease.   SOCIAL HISTORY:  He does not smoke cigarettes.  He does not drink  alcohol.  He smokes pot.  He works at [employer] as a Production designer, theatre/television/film.   INITIAL VITALS:  Temperature 98.3, blood pressure 196/122, heart rate of  83, respiratory rate 18, O2 saturation 99%.  On examination, the patient is awake, alert, oriented to time, place and  person.  The patient is on the overweight side.  Normocephalic, atraumatic.  Pupils equal, round and reactive to light.  Extraocular muscle movement intact.  No elevated JVD.  No carotid bruit.  Mucous membranes moist.  No oral thrush.  LUNGS:  Clear clinically to auscultation.  CARDIOVASCULAR SYSTEM:  S1-S2 regular.  No murmur or gallop.  ABDOMEN:  Not distended, soft, nontender.  Bowel sounds present.  EXTREMITIES:  No pedal edema or calf tenderness.  Dorsalis pedis pulses  palpable bilaterally.  CENTRAL NERVOUS SYSTEM:  No focal neurological deficit.   INITIAL LABORATORY DATA:  Sodium 141, potassium 4.4, chloride 107, BUN  13, creatinine 1.1, glucose 91, calcium 1.15,  bicarb 24.  Hemoglobin  14.3, hematocrit 42, white cell 6.5, platelets 193.  Head CT scan showed  no acute intracranial abnormality.  EKG pending.   ASSESSMENT AND PLAN:  Mr. Delair is a 50 year old  African American male  presenting with hypertensive urgency.  He will be admitted to step-down  unit.  Will continue with nitroprusside infusion with aim to keep  systolic blood pressure between to 140 and 150, diastolic at 90-100 with  a MAP of 100-110.  Will also start lisinopril tablets 20 mg daily,  hydrochlorothiazide 25 mg daily.  Check fasting lipid profile,  urinalysis and EKG.  Will ask financial counselor to see and social  worker to evaluate for setting up outpatient follow-up.  The patient  will ultimately need generic prescription available at [facility] or  [facility] for $4.  Will offer dietary counseling.  He would need an  outpatient physician as well prior to discharge.  Will place on low-salt  diet.      Mobolaji B. Corky Downs, M.D.  Electronically Signed     MBB/MEDQ  D:  08/15/2007  T:  08/15/2007  Job:  [id number]

## 2010-08-02 ENCOUNTER — Ambulatory Visit: Payer: Self-pay | Admitting: Family Medicine

## 2010-11-03 LAB — POCT I-STAT, CHEM 8
BUN: 13
Glucose, Bld: 91
HCT: 42
Potassium: 4.1
Sodium: 141

## 2010-11-03 LAB — URINALYSIS, ROUTINE W REFLEX MICROSCOPIC
Bilirubin Urine: NEGATIVE
Hgb urine dipstick: NEGATIVE
Protein, ur: NEGATIVE
Urobilinogen, UA: 0.2

## 2010-11-03 LAB — LIPID PANEL
Cholesterol: 132
Total CHOL/HDL Ratio: 3.6
Triglycerides: 109

## 2010-11-03 LAB — CBC
MCV: 85.1
RDW: 13.8

## 2010-11-03 LAB — URINE DRUGS OF ABUSE SCREEN W ALC, ROUTINE (REF LAB)
Amphetamine Screen, Ur: NEGATIVE
Benzodiazepines.: NEGATIVE
Cocaine Metabolites: NEGATIVE
Opiate Screen, Urine: NEGATIVE
Propoxyphene: NEGATIVE

## 2010-11-03 LAB — URINE MICROSCOPIC-ADD ON

## 2016-11-02 ENCOUNTER — Emergency Department (HOSPITAL_COMMUNITY): Payer: No Typology Code available for payment source

## 2016-11-02 ENCOUNTER — Encounter (HOSPITAL_COMMUNITY): Payer: Self-pay | Admitting: Emergency Medicine

## 2016-11-02 ENCOUNTER — Emergency Department (HOSPITAL_COMMUNITY)
Admission: EM | Admit: 2016-11-02 | Discharge: 2016-11-02 | Disposition: A | Discharge status: Home / Self Care | Payer: No Typology Code available for payment source | Attending: Emergency Medicine | Admitting: Emergency Medicine

## 2016-11-02 DIAGNOSIS — M25512 Pain in left shoulder: Secondary | ICD-10-CM | POA: Diagnosis not present

## 2016-11-02 DIAGNOSIS — Y9389 Activity, other specified: Secondary | ICD-10-CM | POA: Insufficient documentation

## 2016-11-02 DIAGNOSIS — Y92410 Unspecified street and highway as the place of occurrence of the external cause: Secondary | ICD-10-CM | POA: Insufficient documentation

## 2016-11-02 DIAGNOSIS — Z7982 Long term (current) use of aspirin: Secondary | ICD-10-CM | POA: Insufficient documentation

## 2016-11-02 DIAGNOSIS — R51 Headache: Secondary | ICD-10-CM | POA: Diagnosis not present

## 2016-11-02 DIAGNOSIS — R519 Headache, unspecified: Secondary | ICD-10-CM

## 2016-11-02 DIAGNOSIS — S199XXA Unspecified injury of neck, initial encounter: Secondary | ICD-10-CM | POA: Diagnosis present

## 2016-11-02 DIAGNOSIS — Y998 Other external cause status: Secondary | ICD-10-CM | POA: Insufficient documentation

## 2016-11-02 DIAGNOSIS — M549 Dorsalgia, unspecified: Secondary | ICD-10-CM | POA: Diagnosis not present

## 2016-11-02 DIAGNOSIS — I10 Essential (primary) hypertension: Secondary | ICD-10-CM | POA: Insufficient documentation

## 2016-11-02 DIAGNOSIS — S161XXA Strain of muscle, fascia and tendon at neck level, initial encounter: Secondary | ICD-10-CM

## 2016-11-02 DIAGNOSIS — Z79899 Other long term (current) drug therapy: Secondary | ICD-10-CM | POA: Diagnosis not present

## 2016-11-02 HISTORY — DX: Essential (primary) hypertension: I10

## 2016-11-02 MED ORDER — METHOCARBAMOL 500 MG PO TABS: 1000.0000 mg | ORAL_TABLET | Freq: Four times a day (QID) | ORAL | 0 refills | Status: DC

## 2016-11-02 MED ORDER — NAPROXEN 500 MG PO TABS: 500.0000 mg | ORAL_TABLET | Freq: Two times a day (BID) | ORAL | 0 refills | Status: DC

## 2016-11-02 MED ORDER — METHOCARBAMOL 500 MG PO TABS
1000.0000 mg | ORAL_TABLET | Freq: Once | ORAL | Status: AC
  Administered 2016-11-02: 1000 mg via ORAL
  Filled 2016-11-02: qty 2
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
## 2016-11-02 NOTE — ED Notes (Signed)
Patient transported to CT 
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
## 2017-06-05 ENCOUNTER — Ambulatory Visit (HOSPITAL_COMMUNITY)
Admission: EM | Admit: 2017-06-05 | Discharge: 2017-06-05 | Disposition: A | Discharge status: Home / Self Care | Payer: BLUE CROSS/BLUE SHIELD | Attending: Family Medicine | Admitting: Family Medicine

## 2017-06-05 ENCOUNTER — Encounter (HOSPITAL_COMMUNITY): Payer: Self-pay

## 2017-06-05 ENCOUNTER — Other Ambulatory Visit: Payer: Self-pay

## 2017-06-05 DIAGNOSIS — Z760 Encounter for issue of repeat prescription: Secondary | ICD-10-CM

## 2017-06-05 DIAGNOSIS — I10 Essential (primary) hypertension: Secondary | ICD-10-CM | POA: Diagnosis not present

## 2017-06-05 MED ORDER — CARVEDILOL 12.5 MG PO TABS: 12.5000 mg | ORAL_TABLET | Freq: Two times a day (BID) | ORAL | 0 refills | Status: DC

## 2017-06-05 MED ORDER — CLONIDINE HCL 0.3 MG PO TABS: 0.3000 mg | ORAL_TABLET | Freq: Two times a day (BID) | ORAL | 0 refills | Status: DC

## 2017-06-05 MED ORDER — AMLODIPINE BESYLATE 5 MG PO TABS: 5.0000 mg | ORAL_TABLET | Freq: Every day | ORAL | 0 refills | Status: DC

## 2017-06-05 MED ORDER — HYDROCHLOROTHIAZIDE 25 MG PO TABS: 25.0000 mg | ORAL_TABLET | Freq: Every day | ORAL | 0 refills | Status: DC

## 2017-06-05 MED FILL — cloNIDine HCL 0.3 MG TABS: 0.3 | 30 days supply | Qty: 60 | Fill #0

## 2017-06-05 MED FILL — AMLODIPINE BESYLATE 5 MG TA: 5 | 30 days supply | Qty: 30 | Fill #0

## 2017-06-05 MED FILL — HYDROCHLOROTHIAZIDE 25 MG T: 25 | 30 days supply | Qty: 30 | Fill #0

## 2017-06-05 MED FILL — CARVEDILOL 12.5 MG TABLET: 12.5 | 30 days supply | Qty: 60 | Fill #0

## 2017-06-05 NOTE — ED Triage Notes (Signed)
Pt presents needing his blood pressure medication refilled.

## 2017-06-05 NOTE — ED Provider Notes (Signed)
[facility]    CSN: [id number] Arrival date & time: 06/05/17  4098     History   Chief Complaint Chief Complaint  Patient presents with  . Med Refill    HPI Nicholas Case is a 57 y.o. male.   57 year old male with history of hypertension comes in for medication refill.  He has been seeing Dr. Tami Lin, who recently left his practice, and was told to follow-up here.  He has a PCP appointment in May with his new PCP.  Has been taking amlodipine, HCTZ, Catapres, carvedilol for the past 3 years without problems.  He has been taking medications as directed.  Denies chest pain, shortness of breath, palpitations.  Denies problems with medication.  States last blood work was done a month ago with normal kidney function.      Past Medical History:  Diagnosis Date  . Hypertension     Patient Active Problem List   Diagnosis Date Noted  . Elevated fasting glucose 05/31/2010  . EDEMA 07/02/2009  . GERD 12/22/2008  . ULNAR NEUROPATHY, ELBOW 12/09/2008  . HYPERTENSION 02/03/2008  . OBESITY 02/03/2008    Past Surgical History:  Procedure Laterality Date  . NECK SURGERY         Home Medications    Prior to Admission medications   Medication Sig Start Date End Date Taking? Authorizing Provider  aspirin 81 MG EC tablet Take 81 mg by mouth daily.     Yes [provider]  methocarbamol (ROBAXIN) 500 MG tablet Take 2 tablets (1,000 mg total) by mouth 4 (four) times daily. 11/02/16  Yes Renne Crigler, [name]  naproxen (NAPROSYN) 500 MG tablet Take 1 tablet (500 mg total) by mouth 2 (two) times daily. 11/02/16  Yes Renne Crigler, [name]  amLODipine (NORVASC) 5 MG tablet Take 1 tablet (5 mg total) by mouth daily. 06/05/17   Cathie Hoops, Oluchi Pucci V, [name]  carvedilol (COREG) 12.5 MG tablet Take 1 tablet (12.5 mg total) by mouth 2 (two) times daily with a meal. 06/05/17 07/05/17  Cathie Hoops, Adelia Baptista V, [name]  cloNIDine (CATAPRES) 0.3 MG tablet Take 1 tablet (0.3 mg total) by mouth 2 (two) times daily.  06/05/17 07/05/17  Cathie Hoops, Kelon Easom V, [name]  hydrochlorothiazide (HYDRODIURIL) 25 MG tablet Take 1 tablet (25 mg total) by mouth daily. 06/05/17   Belinda Fisher, [name]    Family History No family history on file.  Social History Social History   Tobacco Use  . Smoking status: Never Smoker  . Smokeless tobacco: Current User  Substance Use Topics  . Alcohol use: No  . Drug use: No     Allergies   Ace inhibitors   Review of Systems Review of Systems  Reason unable to perform ROS: See HPI as above.     Physical Exam Triage Vital Signs ED Triage Vitals  Enc Vitals Group     BP 06/05/17 1019 122/87     Pulse Rate 06/05/17 1019 66     Resp 06/05/17 1019 16     Temp 06/05/17 1019 99.1 F (37.3 C)     Temp Source 06/05/17 1019 Oral     SpO2 06/05/17 1019 99 %     Weight 06/05/17 1025 240 lb (108.9 kg)     Height --      Head Circumference --      Peak Flow --      Pain Score 06/05/17 1025 0     Pain Loc --      Pain Edu? --  Excl. in GC? --    No data found.  Updated Vital Signs BP 122/87 (BP Location: Left Arm)   Pulse 66   Temp 99.1 F (37.3 C) (Oral)   Resp 16   Wt 240 lb (108.9 kg)   SpO2 99%   BMI 32.55 kg/m   Physical Exam  Constitutional: He is oriented to person, place, and time. He appears well-developed and well-nourished. No distress.  HENT:  Head: Normocephalic and atraumatic.  Eyes: Pupils are equal, round, and reactive to light. Conjunctivae are normal.  Cardiovascular: Normal rate, regular rhythm and normal heart sounds. Exam reveals no gallop and no friction rub.  No murmur heard. Pulmonary/Chest: Effort normal and breath sounds normal. No accessory muscle usage or stridor. No respiratory distress. He has no decreased breath sounds. He has no wheezes. He has no rhonchi. He has no rales.  Neurological: He is alert and oriented to person, place, and time.  Skin: He is not diaphoretic.     UC Treatments / Results  Labs (all labs ordered are listed,  but only abnormal results are displayed) Labs Reviewed - No data to display  EKG None  Radiology No results found.  Procedures Procedures (including critical care time)  Medications Ordered in UC Medications - No data to display  Initial Impression / Assessment and Plan / UC Course  I have reviewed the triage vital signs and the nursing notes.  Pertinent labs & imaging results that were available during my care of the patient were reviewed by me and considered in my medical decision making (see chart for details).    Medication refilled for 30 days.  Patient to follow-up with new PCP as scheduled for further evaluation and management needed.  Return precautions given.  Patient expresses understanding and agrees to plan.  Final Clinical Impressions(s) / UC Diagnoses   Final diagnoses:  Medication refill     Discharge Instructions     Medication refilled for 30 days. Follow up with new PCP as scheduled for further management needed.    ED Prescriptions    Medication Sig Dispense Auth. Provider   hydrochlorothiazide (HYDRODIURIL) 25 MG tablet Take 1 tablet (25 mg total) by mouth daily. 30 tablet Gavyn Zoss V, [name]   amLODipine (NORVASC) 5 MG tablet Take 1 tablet (5 mg total) by mouth daily. 30 tablet Lois Ostrom V, [name]   carvedilol (COREG) 12.5 MG tablet Take 1 tablet (12.5 mg total) by mouth 2 (two) times daily with a meal. 60 tablet Musab Wingard V, [name]   cloNIDine (CATAPRES) 0.3 MG tablet Take 1 tablet (0.3 mg total) by mouth 2 (two) times daily. 60 tablet Threasa Alpha, [place] 06/05/17 1050

## 2017-06-05 NOTE — Discharge Instructions (Signed)
Medication refilled for 30 days. Follow up with new PCP as scheduled for further management needed.

## 2017-06-20 ENCOUNTER — Ambulatory Visit (INDEPENDENT_AMBULATORY_CARE_PROVIDER_SITE_OTHER): Payer: Self-pay | Admitting: Nurse Practitioner

## 2017-07-10 ENCOUNTER — Encounter (HOSPITAL_COMMUNITY): Payer: Self-pay | Admitting: Emergency Medicine

## 2017-07-10 ENCOUNTER — Ambulatory Visit (HOSPITAL_COMMUNITY)
Admission: EM | Admit: 2017-07-10 | Discharge: 2017-07-10 | Disposition: A | Discharge status: Home / Self Care | Payer: BLUE CROSS/BLUE SHIELD | Attending: Physician Assistant | Admitting: Physician Assistant

## 2017-07-10 DIAGNOSIS — I10 Essential (primary) hypertension: Secondary | ICD-10-CM | POA: Diagnosis not present

## 2017-07-10 DIAGNOSIS — Z760 Encounter for issue of repeat prescription: Secondary | ICD-10-CM

## 2017-07-10 MED ORDER — HYDROCHLOROTHIAZIDE 25 MG PO TABS: 25.0000 mg | ORAL_TABLET | Freq: Every day | ORAL | 0 refills | Status: DC

## 2017-07-10 MED ORDER — AMLODIPINE BESYLATE 5 MG PO TABS: 5.0000 mg | ORAL_TABLET | Freq: Every day | ORAL | 0 refills | Status: DC

## 2017-07-10 MED ORDER — CARVEDILOL 12.5 MG PO TABS: 12.5000 mg | ORAL_TABLET | Freq: Two times a day (BID) | ORAL | 0 refills | Status: DC

## 2017-07-10 MED ORDER — CLONIDINE HCL 0.3 MG PO TABS: 0.3000 mg | ORAL_TABLET | Freq: Two times a day (BID) | ORAL | 0 refills | Status: DC

## 2017-07-10 MED FILL — HYDROCHLOROTHIAZIDE 25 MG T: 25 | 30 days supply | Qty: 30 | Fill #0

## 2017-07-10 MED FILL — AMLODIPINE BESYLATE 5 MG TA: 5 | 30 days supply | Qty: 30 | Fill #0

## 2017-07-10 MED FILL — CARVEDILOL 12.5 MG TABLET: 12.5 | 30 days supply | Qty: 60 | Fill #0

## 2017-07-10 MED FILL — cloNIDine HCL 0.3 MG TABS: 0.3 | 30 days supply | Qty: 60 | Fill #0

## 2017-07-10 NOTE — ED Provider Notes (Signed)
07/10/2017 10:30 AM   DOB: 05/12/1960 / MRN: [id number]  SUBJECTIVE:  Nicholas Case is a 57 y.o. male presenting for med refill.  Per Dr. Loma BostonLauenstein's last note:  "57 year old male with history of hypertension comes in for medication refill.  He has been seeing Dr. Tami LinJedge, who recently left his practice, and was told to follow-up here.  He has a PCP appointment in May with his new PCP.  Has been taking amlodipine, HCTZ, Catapres, carvedilol for the past 3 years without problems.  He has been taking medications as directed.  Denies chest pain, shortness of breath, palpitations.  Denies problems with medication.  States last blood work was done a month ago with normal kidney function."  He notes no changes.  He feels well.  He is allergic to ace inhibitors.   He  has a past medical history of Hypertension.    He  reports that he has never smoked. He uses smokeless tobacco. He reports that he does not drink alcohol or use drugs. He  has no sexual activity history on file. The patient  has a past surgical history that includes Neck surgery.  His family history is not on file.  Review of Systems  Constitutional: Negative for chills, diaphoresis and fever.  Eyes: Negative.   Respiratory: Negative for cough, hemoptysis, sputum production, shortness of breath and wheezing.   Cardiovascular: Negative for chest pain, orthopnea and leg swelling.  Gastrointestinal: Negative for nausea.  Skin: Negative for rash.  Neurological: Negative for dizziness, sensory change, speech change, focal weakness and headaches.    OBJECTIVE:  BP 129/85 (BP Location: Left Arm)   Pulse 65   Temp 97.7 F (36.5 C) (Oral)   Resp 18   SpO2 95%   Wt Readings from Last 3 Encounters:  06/05/17 240 lb (108.9 kg)  06/15/10 (!) 240 lb (108.9 kg)  05/31/10 (!) 239 lb 1.6 oz (108.5 kg)   Temp Readings from Last 3 Encounters:  07/10/17 97.7 F (36.5 C) (Oral)  06/05/17 99.1 F (37.3 C) (Oral)  11/02/16 98.3 F (36.8 C)  (Oral)   BP Readings from Last 3 Encounters:  07/10/17 129/85  06/05/17 122/87  11/02/16 (!) 155/99   Pulse Readings from Last 3 Encounters:  07/10/17 65  06/05/17 66  11/02/16 (!) 54    Physical Exam  Constitutional: He is oriented to person, place, and time. He appears well-developed. He does not appear ill.  Eyes: Pupils are equal, round, and reactive to light. Conjunctivae and EOM are normal.  Cardiovascular: Normal rate, regular rhythm, S1 normal, S2 normal, normal heart sounds, intact distal pulses and normal pulses. Exam reveals no gallop and no friction rub.  No murmur heard. Pulmonary/Chest: Effort normal. No stridor. No respiratory distress. He has no wheezes. He has no rales.  Abdominal: He exhibits no distension.  Musculoskeletal: Normal range of motion. He exhibits no edema, tenderness or deformity.  Neurological: He is alert and oriented to person, place, and time. He displays normal reflexes. No cranial nerve deficit or sensory deficit. He exhibits normal muscle tone. Coordination normal.  Skin: Skin is warm and dry. He is not diaphoretic.  Psychiatric: He has a normal mood and affect.  Nursing note and vitals reviewed.   No results found for this or any previous visit (from the past 72 hour(s)).  No results found.  ASSESSMENT AND PLAN:   Encounter for medication refill  Well-controlled hypertension: I have refilled per patient's request. He has also requested for a call  from the [facility] to help him get established.     Discharge Instructions     Meds are waiting the the pharmacy.  Come back if you need [place]. Hopefully you can get in with a PCP quickly.         The patient is advised to call or return to clinic if he does not see an improvement in symptoms, or to seek the care of the closest emergency department if he worsens with the above plan.   Deliah Boston, MHS, [name] 07/10/2017 10:30 AM   Ofilia Neas, [name] 07/10/17 1030

## 2017-07-10 NOTE — ED Triage Notes (Signed)
Pt here for blood pressure medication refill

## 2017-07-10 NOTE — Discharge Instructions (Signed)
Meds are waiting the the pharmacy.  Come back if you need us. Hopefully you can get in with a PCP quickly.

## 2017-08-13 ENCOUNTER — Ambulatory Visit (HOSPITAL_COMMUNITY)
Admission: EM | Admit: 2017-08-13 | Discharge: 2017-08-13 | Disposition: A | Discharge status: Home / Self Care | Payer: BLUE CROSS/BLUE SHIELD | Attending: Family Medicine | Admitting: Family Medicine

## 2017-08-13 ENCOUNTER — Encounter (HOSPITAL_COMMUNITY): Payer: Self-pay | Admitting: Emergency Medicine

## 2017-08-13 DIAGNOSIS — Z760 Encounter for issue of repeat prescription: Secondary | ICD-10-CM | POA: Diagnosis not present

## 2017-08-13 DIAGNOSIS — I10 Essential (primary) hypertension: Secondary | ICD-10-CM

## 2017-08-13 LAB — POCT I-STAT, CHEM 8
BUN: 14 mg/dL (ref 6–20)
CALCIUM ION: 1.17 mmol/L (ref 1.15–1.40)
CHLORIDE: 104 mmol/L (ref 98–111)
Creatinine, Ser: 1 mg/dL (ref 0.61–1.24)
GLUCOSE: 125 mg/dL — AB (ref 70–99)
HCT: 46 % (ref 39.0–52.0)
Hemoglobin: 15.6 g/dL (ref 13.0–17.0)
POTASSIUM: 4 mmol/L (ref 3.5–5.1)
Sodium: 142 mmol/L (ref 135–145)
TCO2: 25 mmol/L (ref 22–32)

## 2017-08-13 MED ORDER — HYDROCHLOROTHIAZIDE 25 MG PO TABS: 25.0000 mg | ORAL_TABLET | Freq: Every day | ORAL | 0 refills | Status: DC

## 2017-08-13 MED ORDER — CARVEDILOL 12.5 MG PO TABS: 12.5000 mg | ORAL_TABLET | Freq: Two times a day (BID) | ORAL | 0 refills | Status: DC

## 2017-08-13 MED ORDER — AMLODIPINE BESYLATE 5 MG PO TABS: 5.0000 mg | ORAL_TABLET | Freq: Every day | ORAL | 0 refills | Status: DC

## 2017-08-13 MED ORDER — CLONIDINE HCL 0.3 MG PO TABS: 0.3000 mg | ORAL_TABLET | Freq: Two times a day (BID) | ORAL | 0 refills | Status: DC

## 2017-08-13 MED FILL — AMLODIPINE BESYLATE 5 MG TA: 5 | 30 days supply | Qty: 30 | Fill #0

## 2017-08-13 MED FILL — CARVEDILOL 12.5 MG TABLET: 12.5 | 30 days supply | Qty: 60 | Fill #0

## 2017-08-13 MED FILL — cloNIDine HCL 0.3 MG TABS: 0.3 | 30 days supply | Qty: 60 | Fill #0

## 2017-08-13 MED FILL — HYDROCHLOROTHIAZIDE 25 MG T: 25 | 30 days supply | Qty: 30 | Fill #0

## 2017-08-13 NOTE — Discharge Instructions (Signed)
Please refill your blood pressure medications and take as prescribed. Please establish with a primary care provider for continued management and monitoring of your blood pressure and medications. If develop chest pain, weakness, nausea, sweating, arm or jaw pain please go to the Er.

## 2017-08-13 NOTE — ED Provider Notes (Signed)
[facility]    CSN: [id number] Arrival date & time: 08/13/17  [id number]     History   Chief Complaint Chief Complaint  Patient presents with  . Medication Refill    HPI Nicholas Case is a 57 y.o. male.   Nicholas Case presents for request for his blood pressure medications to be refilled. He has been out of medications since 08/09/17. He does not currently have a PCP and states has not been able to get in with a provider. Occasionally has dizziness, denies currently. No chest pain , no shortness of breath . Has occasional headache, currently a mild headache, rates it 4/10. Has not taken any medications for headache. Occasional leg swelling if he stands for long periods of time, no current swelling. No other medications he is taking regularly. Has been seen for refills of BP medications- coreg, amlodipine, hctz, clonidine- for the past three months.     ROS per HPI.      Past Medical History:  Diagnosis Date  . Hypertension     Patient Active Problem List   Diagnosis Date Noted  . Elevated fasting glucose 05/31/2010  . EDEMA 07/02/2009  . GERD 12/22/2008  . ULNAR NEUROPATHY, ELBOW 12/09/2008  . HYPERTENSION 02/03/2008  . OBESITY 02/03/2008    Past Surgical History:  Procedure Laterality Date  . NECK SURGERY         Home Medications    Prior to Admission medications   Medication Sig Start Date End Date Taking? Authorizing Provider  aspirin 81 MG EC tablet Take 81 mg by mouth daily.     Yes [provider]  amLODipine (NORVASC) 5 MG tablet Take 1 tablet (5 mg total) by mouth daily. 08/13/17   Georgetta HaberBurky, Lemon Sternberg B, NP  carvedilol (COREG) 12.5 MG tablet Take 1 tablet (12.5 mg total) by mouth 2 (two) times daily with a meal. 08/13/17 09/12/17  Linus MakoBurky, Caroleann Casler B, NP  cloNIDine (CATAPRES) 0.3 MG tablet Take 1 tablet (0.3 mg total) by mouth 2 (two) times daily. 08/13/17 09/12/17  Georgetta HaberBurky, Mendy Chou B, NP  hydrochlorothiazide (HYDRODIURIL) 25 MG tablet Take 1 tablet (25 mg total)  by mouth daily. 08/13/17   Georgetta HaberBurky, Benjermin Korber B, NP    Family History No family history on file.  Social History Social History   Tobacco Use  . Smoking status: Never Smoker  . Smokeless tobacco: Current User  Substance Use Topics  . Alcohol use: No  . Drug use: No     Allergies   Ace inhibitors   Review of Systems Review of Systems   Physical Exam Triage Vital Signs ED Triage Vitals  Enc Vitals Group     BP 08/13/17 0952 (!) 180/113     Pulse Rate 08/13/17 0952 (!) 53     Resp 08/13/17 0952 16     Temp 08/13/17 0952 98.4 F (36.9 C)     Temp Source 08/13/17 0952 Oral     SpO2 08/13/17 0952 99 %     Weight 08/13/17 0951 240 lb (108.9 kg)     Height --      Head Circumference --      Peak Flow --      Pain Score 08/13/17 0951 0     Pain Loc --      Pain Edu? --      Excl. in GC? --    No data found.  Updated Vital Signs BP (!) 180/113   Pulse (!) 53   Temp 98.4 F (36.9  C) (Oral)   Resp 16   Wt 240 lb (108.9 kg)   SpO2 99%   BMI 32.55 kg/m    Physical Exam  Constitutional: He is oriented to person, place, and time. He appears well-developed and well-nourished.  HENT:  Head: Normocephalic and atraumatic.  Eyes: Pupils are equal, round, and reactive to light. EOM are normal.  Cardiovascular: Regular rhythm and normal heart sounds.  Pulmonary/Chest: Effort normal and breath sounds normal.  Musculoskeletal: Normal range of motion.  No lower extremity edema visible or palpable   Neurological: He is alert and oriented to person, place, and time. No cranial nerve deficit.  Skin: Skin is warm and dry.     UC Treatments / Results  Labs (all labs ordered are listed, but only abnormal results are displayed) Labs Reviewed - No data to display  EKG None  Radiology No results found.  Procedures Procedures (including critical care time)  Medications Ordered in UC Medications - No data to display  Initial Impression / Assessment and Plan / UC Course   I have reviewed the triage vital signs and the nursing notes.  Pertinent labs & imaging results that were available during my care of the patient were reviewed by me and considered in my medical decision making (see chart for details).     Noted HTN in clinic today. Has been out of medications for the past 4 days. Without indications of crisis at this time. No chest pain  Or shortness of breath. Per chart review has been on same medications for some time now. Chem 8 collected today, reassuring. Discussed significance of finding a PCP for management of BP and medications. Return precautions provided. Patient verbalized understanding and agreeable to plan.  Ambulatory out of clinic without difficulty.    Final Clinical Impressions(s) / UC Diagnoses   Final diagnoses:  Encounter for medication refill  Hypertension, unspecified type     Discharge Instructions     Please refill your blood pressure medications and take as prescribed. Please establish with a primary care provider for continued management and monitoring of your blood pressure and medications. If develop chest pain, weakness, nausea, sweating, arm or jaw pain please go to the Er.     ED Prescriptions    Medication Sig Dispense Auth. Provider   amLODipine (NORVASC) 5 MG tablet Take 1 tablet (5 mg total) by mouth daily. 30 tablet Linus Mako B, NP   carvedilol (COREG) 12.5 MG tablet Take 1 tablet (12.5 mg total) by mouth 2 (two) times daily with a meal. 60 tablet Vicky Mccanless, Dorene Grebe B, NP   cloNIDine (CATAPRES) 0.3 MG tablet Take 1 tablet (0.3 mg total) by mouth 2 (two) times daily. 60 tablet Linus Mako B, NP   hydrochlorothiazide (HYDRODIURIL) 25 MG tablet Take 1 tablet (25 mg total) by mouth daily. 30 tablet Georgetta Haber, NP     Controlled Substance Prescriptions [place] Controlled Substance Registry consulted? Not Applicable   Georgetta Haber, NP 08/13/17 1028

## 2017-08-13 NOTE — ED Triage Notes (Signed)
PT has been out of his meds for 3 days. PT takes 4 cardiac meds.

## 2017-09-11 ENCOUNTER — Other Ambulatory Visit: Payer: Self-pay

## 2017-09-11 ENCOUNTER — Ambulatory Visit (INDEPENDENT_AMBULATORY_CARE_PROVIDER_SITE_OTHER): Payer: BLUE CROSS/BLUE SHIELD | Admitting: Physician Assistant

## 2017-09-11 ENCOUNTER — Encounter (INDEPENDENT_AMBULATORY_CARE_PROVIDER_SITE_OTHER): Payer: Self-pay | Admitting: Physician Assistant

## 2017-09-11 VITALS — BP 153/109 | HR 73 | Temp 98.0°F | Ht 72.0 in | Wt 228.4 lb

## 2017-09-11 DIAGNOSIS — Z114 Encounter for screening for human immunodeficiency virus [HIV]: Secondary | ICD-10-CM | POA: Diagnosis not present

## 2017-09-11 DIAGNOSIS — R7303 Prediabetes: Secondary | ICD-10-CM

## 2017-09-11 DIAGNOSIS — R739 Hyperglycemia, unspecified: Secondary | ICD-10-CM

## 2017-09-11 DIAGNOSIS — I10 Essential (primary) hypertension: Secondary | ICD-10-CM | POA: Diagnosis not present

## 2017-09-11 DIAGNOSIS — Z1211 Encounter for screening for malignant neoplasm of colon: Secondary | ICD-10-CM

## 2017-09-11 DIAGNOSIS — Z1159 Encounter for screening for other viral diseases: Secondary | ICD-10-CM

## 2017-09-11 LAB — POCT GLYCOSYLATED HEMOGLOBIN (HGB A1C): Hemoglobin A1C: 6.3 % — AB (ref 4.0–5.6)

## 2017-09-11 MED ORDER — METFORMIN HCL ER 500 MG PO TB24: 500.0000 mg | ORAL_TABLET | Freq: Every day | ORAL | 1 refills | Status: DC

## 2017-09-11 MED ORDER — AMLODIPINE BESYLATE 5 MG PO TABS: 5.0000 mg | ORAL_TABLET | Freq: Every day | ORAL | 1 refills | Status: DC

## 2017-09-11 MED ORDER — ASPIRIN 81 MG PO TBEC: 81.0000 mg | DELAYED_RELEASE_TABLET | Freq: Every day | ORAL | 3 refills | Status: AC

## 2017-09-11 MED ORDER — HYDROCHLOROTHIAZIDE 25 MG PO TABS: 25.0000 mg | ORAL_TABLET | Freq: Every day | ORAL | 1 refills | Status: DC

## 2017-09-11 MED ORDER — CARVEDILOL 12.5 MG PO TABS: 12.5000 mg | ORAL_TABLET | Freq: Two times a day (BID) | ORAL | 1 refills | Status: DC

## 2017-09-11 MED ORDER — CLONIDINE HCL 0.3 MG PO TABS
0.3000 mg | ORAL_TABLET | Freq: Two times a day (BID) | ORAL | 1 refills | Status: DC
Start: 2017-09-11 — End: 2017-10-11

## 2017-09-11 MED FILL — CARVEDILOL 12.5 MG TABLET: 12.5 | 30 days supply | Qty: 60 | Fill #0

## 2017-09-11 MED FILL — AMLODIPINE BESYLATE 5 MG TA: 5 | 30 days supply | Qty: 30 | Fill #0

## 2017-09-11 MED FILL — HYDROCHLOROTHIAZIDE 25 MG T: 25 | 30 days supply | Qty: 30 | Fill #0

## 2017-09-11 MED FILL — cloNIDine HCL 0.3 MG TABS: 0.3 | 30 days supply | Qty: 60 | Fill #0

## 2017-09-11 NOTE — Progress Notes (Signed)
Subjective:  Patient ID: Nicholas Case, male    DOB: 01/20/1961  Age: 57 y.o. MRN: [id number]  CC: flank pain. Medication refills.  HPI Nicholas Case is a 57 y.o. male with a medical history of HTN, obesity, and prediabetes presents as a new patient for management of hypertension. Has been going to the ED for anti-hypertensive medication refills. Last BP 180/113 and HR 53 at the ED nearly one month ago. Has been out of medications prescribed at the ED for approximately 2-2.5 days. Pt reports BP in the 120s/80s when taking medications. Endorses LE edema but not currently. Does not endorse CP, palpitations, SOB, HA, abdominal pain, f/c/n/v, presyncope, syncope, PND, orthopnea, rash, or GI/GU sxs.       Outpatient Medications Prior to Visit  Medication Sig Dispense Refill  . amLODipine (NORVASC) 5 MG tablet Take 1 tablet (5 mg total) by mouth daily. 30 tablet 0  . aspirin 81 MG EC tablet Take 81 mg by mouth daily.      . carvedilol (COREG) 12.5 MG tablet Take 1 tablet (12.5 mg total) by mouth 2 (two) times daily with a meal. 60 tablet 0  . cloNIDine (CATAPRES) 0.3 MG tablet Take 1 tablet (0.3 mg total) by mouth 2 (two) times daily. 60 tablet 0  . hydrochlorothiazide (HYDRODIURIL) 25 MG tablet Take 1 tablet (25 mg total) by mouth daily. 30 tablet 0   No facility-administered medications prior to visit.      ROS Review of Systems  Constitutional: Negative for chills, fever and malaise/fatigue.  Eyes: Negative for blurred vision.  Respiratory: Negative for shortness of breath.   Cardiovascular: Positive for leg swelling. Negative for chest pain and palpitations.  Gastrointestinal: Negative for abdominal pain and nausea.  Genitourinary: Negative for dysuria and hematuria.  Musculoskeletal: Negative for joint pain and myalgias.  Skin: Negative for rash.  Neurological: Negative for tingling and headaches.  Psychiatric/Behavioral: Negative for depression. The patient is not  nervous/anxious.     Objective:  Ht 6' (1.829 m)   Wt 228 lb 6.4 oz (103.6 kg)   BMI 30.98 kg/m   Vitals:   09/11/17 0833  BP: (!) 153/109  Pulse: 73  Temp: 98 F (36.7 C)  TempSrc: Oral  SpO2: 97%  Weight: 228 lb 6.4 oz (103.6 kg)  Height: 6' (1.829 m)      Physical Exam  Constitutional: He is oriented to person, place, and time.  Well developed, well nourished, NAD, polite  HENT:  Head: Normocephalic and atraumatic.  Eyes: No scleral icterus.  Neck: Normal range of motion. Neck supple. No thyromegaly present.  Cardiovascular: Normal rate, regular rhythm and normal heart sounds.  No LE edema bilaterally. No carotid bruit bilaterally.  Pulmonary/Chest: Effort normal and breath sounds normal.  Musculoskeletal: He exhibits no edema.  Neurological: He is alert and oriented to person, place, and time.  Skin: Skin is warm and dry. No rash noted. No erythema. No pallor.  Psychiatric: He has a normal mood and affect. His behavior is normal. Thought content normal.  Vitals reviewed.    Assessment & Plan:   1. Essential hypertension - Comprehensive metabolic panel - CBC with Differential - Lipid panel - TSH - Refill hydrochlorothiazide (HYDRODIURIL) 25 MG tablet; Take 1 tablet (25 mg total) by mouth daily.  Dispense: 90 tablet; Refill: 1 - Refill cloNIDine (CATAPRES) 0.3 MG tablet; Take 1 tablet (0.3 mg total) by mouth 2 (two) times daily.  Dispense: 180 tablet; Refill: 1 - Refill carvedilol (COREG) 12.5  MG tablet; Take 1 tablet (12.5 mg total) by mouth 2 (two) times daily with a meal.  Dispense: 180 tablet; Refill: 1 - Refill aspirin 81 MG EC tablet; Take 1 tablet (81 mg total) by mouth daily.  Dispense: 90 tablet; Refill: 3 - Refill amLODipine (NORVASC) 5 MG tablet; Take 1 tablet (5 mg total) by mouth daily.  Dispense: 90 tablet; Refill: 1 - VAS US RENAL ARTERY DUPLEX; Future  2. Prediabetes - A1c 6.3% today. Pt says "I can not resist chocolate" but wants to try to  lower his sweet intake again. I have printed metformin for him to take in case he fails at his attempt to cut out sweets from his diet.  - Begin Metformin 500 mg XR one tablet po daily x90 days with one refill  3. Hyperglycemia - HgB A1c 6.3% today  4. Screening for HIV (human immunodeficiency virus) - HIV antibody  5. Need for hepatitis C screening test - Hepatitis c antibody (reflex)  6. Screening for colon cancer - Fecal occult blood, imunochemical   Meds ordered this encounter  Medications  . hydrochlorothiazide (HYDRODIURIL) 25 MG tablet    Sig: Take 1 tablet (25 mg total) by mouth daily.    Dispense:  90 tablet    Refill:  1    Order Specific Question:   Supervising Provider    Answer:   Hoy Register [[id number]]  . cloNIDine (CATAPRES) 0.3 MG tablet    Sig: Take 1 tablet (0.3 mg total) by mouth 2 (two) times daily.    Dispense:  180 tablet    Refill:  1    Order Specific Question:   Supervising Provider    Answer:   Hoy Register [[id number]]  . carvedilol (COREG) 12.5 MG tablet    Sig: Take 1 tablet (12.5 mg total) by mouth 2 (two) times daily with a meal.    Dispense:  180 tablet    Refill:  1    Order Specific Question:   Supervising Provider    Answer:   Hoy Register [[id number]]  . aspirin 81 MG EC tablet    Sig: Take 1 tablet (81 mg total) by mouth daily.    Dispense:  90 tablet    Refill:  3    Order Specific Question:   Supervising Provider    Answer:   Hoy Register [[id number]]  . amLODipine (NORVASC) 5 MG tablet    Sig: Take 1 tablet (5 mg total) by mouth daily.    Dispense:  90 tablet    Refill:  1    Order Specific Question:   Supervising Provider    Answer:   Hoy Register [[id number]]  . metFORMIN (GLUCOPHAGE-XR) 500 MG 24 hr tablet    Sig: Take 1 tablet (500 mg total) by mouth daily with breakfast.    Dispense:  90 tablet    Refill:  1    Order Specific Question:   Supervising Provider    Answer:   Hoy Register [[id number]]    Follow-up: Return in about 1  month (around 10/09/2017) for HTN.   Loletta Specter PA

## 2017-09-11 NOTE — Patient Instructions (Signed)

## 2017-09-12 ENCOUNTER — Other Ambulatory Visit (INDEPENDENT_AMBULATORY_CARE_PROVIDER_SITE_OTHER): Payer: Self-pay | Admitting: Physician Assistant

## 2017-09-12 ENCOUNTER — Telehealth (INDEPENDENT_AMBULATORY_CARE_PROVIDER_SITE_OTHER): Payer: Self-pay

## 2017-09-12 DIAGNOSIS — E7841 Elevated Lipoprotein(a): Secondary | ICD-10-CM

## 2017-09-12 LAB — COMPREHENSIVE METABOLIC PANEL
A/G RATIO: 1.5 (ref 1.2–2.2)
ALT: 16 IU/L (ref 0–44)
AST: 15 IU/L (ref 0–40)
Albumin: 4.3 g/dL (ref 3.5–5.5)
Alkaline Phosphatase: 99 IU/L (ref 39–117)
BUN/Creatinine Ratio: 11 (ref 9–20)
BUN: 11 mg/dL (ref 6–24)
Bilirubin Total: 0.2 mg/dL (ref 0.0–1.2)
CALCIUM: 9 mg/dL (ref 8.7–10.2)
CO2: 21 mmol/L (ref 20–29)
Chloride: 104 mmol/L (ref 96–106)
Creatinine, Ser: 1 mg/dL (ref 0.76–1.27)
GFR, EST AFRICAN AMERICAN: 98 mL/min/{1.73_m2} (ref >59)
GFR, EST NON AFRICAN AMERICAN: 84 mL/min/{1.73_m2} (ref >59)
Globulin, Total: 2.8 g/dL (ref 1.5–4.5)
Glucose: 122 mg/dL — ABNORMAL HIGH (ref 65–99)
POTASSIUM: 3.8 mmol/L (ref 3.5–5.2)
SODIUM: 141 mmol/L (ref 134–144)
Total Protein: 7.1 g/dL (ref 6.0–8.5)

## 2017-09-12 LAB — CBC WITH DIFFERENTIAL/PLATELET
BASOS: 0 %
Basophils Absolute: 0 10*3/uL (ref 0.0–0.2)
EOS (ABSOLUTE): 0.1 10*3/uL (ref 0.0–0.4)
Eos: 3 %
Hematocrit: 42.8 % (ref 37.5–51.0)
Hemoglobin: 14.5 g/dL (ref 13.0–17.7)
Immature Grans (Abs): 0 10*3/uL (ref 0.0–0.1)
Immature Granulocytes: 0 %
LYMPHS ABS: 2.1 10*3/uL (ref 0.7–3.1)
Lymphs: 40 %
MCH: 28.5 pg (ref 26.6–33.0)
MCHC: 33.9 g/dL (ref 31.5–35.7)
MCV: 84 fL (ref 79–97)
MONOS ABS: 0.4 10*3/uL (ref 0.1–0.9)
Monocytes: 7 %
NEUTROS ABS: 2.6 10*3/uL (ref 1.4–7.0)
Neutrophils: 50 %
PLATELETS: 209 10*3/uL (ref 150–450)
RBC: 5.08 x10E6/uL (ref 4.14–5.80)
RDW: 14.7 % (ref 12.3–15.4)
WBC: 5.2 10*3/uL (ref 3.4–10.8)

## 2017-09-12 LAB — LIPID PANEL
Chol/HDL Ratio: 4.2 ratio (ref 0.0–5.0)
Cholesterol, Total: 189 mg/dL (ref 100–199)
HDL: 45 mg/dL (ref >39)
LDL Calculated: 131 mg/dL — ABNORMAL HIGH (ref 0–99)
Triglycerides: 64 mg/dL (ref 0–149)
VLDL CHOLESTEROL CAL: 13 mg/dL (ref 5–40)

## 2017-09-12 LAB — HIV ANTIBODY (ROUTINE TESTING W REFLEX): HIV Screen 4th Generation wRfx: NONREACTIVE

## 2017-09-12 LAB — HCV COMMENT:

## 2017-09-12 LAB — HEPATITIS C ANTIBODY (REFLEX)

## 2017-09-12 LAB — TSH: TSH: 2.23 u[IU]/mL (ref 0.450–4.500)

## 2017-09-12 MED ORDER — ATORVASTATIN CALCIUM 40 MG PO TABS: 40.0000 mg | ORAL_TABLET | Freq: Every day | ORAL | 3 refills | Status: DC

## 2017-09-12 MED FILL — ATORVASTATIN CALCIUM 40 MG: 40 | 90 days supply | Qty: 90 | Fill #0

## 2017-09-12 NOTE — Telephone Encounter (Signed)
Patient is aware that all labs are normal except elevated cholesterol; atorvastatin sent to [facility]. Maryjean Mornempestt S Roberts, CMA

## 2017-09-12 NOTE — Telephone Encounter (Signed)
-----   Message from Loletta Specteroger David Gomez, [name] sent at 09/12/2017  1:45 PM EDT ----- All results normal except for elevated cholesterol. I have sent atorvastatin to [facility].

## 2017-09-14 ENCOUNTER — Ambulatory Visit (HOSPITAL_COMMUNITY)
Admission: RE | Admit: 2017-09-14 | Discharge: 2017-09-14 | Disposition: A | Discharge status: Home / Self Care | Payer: BLUE CROSS/BLUE SHIELD | Source: Ambulatory Visit | Attending: Physician Assistant | Admitting: Physician Assistant

## 2017-09-14 DIAGNOSIS — I10 Essential (primary) hypertension: Secondary | ICD-10-CM | POA: Diagnosis not present

## 2017-09-18 ENCOUNTER — Telehealth (INDEPENDENT_AMBULATORY_CARE_PROVIDER_SITE_OTHER): Payer: Self-pay

## 2017-09-18 NOTE — Telephone Encounter (Signed)
Patient aware that renal artery stenosis is not the cause of his hypertension. Advised to take medication as directed for now. Maryjean Mornempestt S Roberts, CMA

## 2017-09-18 NOTE — Telephone Encounter (Signed)
-----   Message from Loletta Specteroger David Gomez, [name] sent at 09/17/2017  9:05 AM EDT ----- No renal artery stenosis as cause of hypertension. Take medications as directed for now.

## 2017-10-11 ENCOUNTER — Other Ambulatory Visit: Payer: Self-pay

## 2017-10-11 ENCOUNTER — Ambulatory Visit (INDEPENDENT_AMBULATORY_CARE_PROVIDER_SITE_OTHER): Payer: BLUE CROSS/BLUE SHIELD | Admitting: Physician Assistant

## 2017-10-11 ENCOUNTER — Encounter (INDEPENDENT_AMBULATORY_CARE_PROVIDER_SITE_OTHER): Payer: Self-pay | Admitting: Physician Assistant

## 2017-10-11 VITALS — BP 121/80 | HR 52 | Temp 97.9°F | Ht 72.0 in | Wt 222.2 lb

## 2017-10-11 DIAGNOSIS — I10 Essential (primary) hypertension: Secondary | ICD-10-CM

## 2017-10-11 DIAGNOSIS — E7841 Elevated Lipoprotein(a): Secondary | ICD-10-CM

## 2017-10-11 DIAGNOSIS — L600 Ingrowing nail: Secondary | ICD-10-CM

## 2017-10-11 MED ORDER — CARVEDILOL 12.5 MG PO TABS: 12.5000 mg | ORAL_TABLET | Freq: Two times a day (BID) | ORAL | 5 refills | Status: DC

## 2017-10-11 MED ORDER — METFORMIN HCL ER 500 MG PO TB24: 500.0000 mg | ORAL_TABLET | Freq: Every day | ORAL | 5 refills | Status: DC

## 2017-10-11 MED ORDER — HYDROCHLOROTHIAZIDE 25 MG PO TABS: 25.0000 mg | ORAL_TABLET | Freq: Every day | ORAL | 5 refills | Status: AC

## 2017-10-11 MED ORDER — CLONIDINE HCL 0.3 MG PO TABS: 0.3000 mg | ORAL_TABLET | Freq: Two times a day (BID) | ORAL | 5 refills | Status: DC

## 2017-10-11 MED ORDER — AMLODIPINE BESYLATE 5 MG PO TABS: 5.0000 mg | ORAL_TABLET | Freq: Every day | ORAL | 5 refills | Status: AC

## 2017-10-11 MED ORDER — ATORVASTATIN CALCIUM 40 MG PO TABS: 40.0000 mg | ORAL_TABLET | Freq: Every day | ORAL | 5 refills | Status: DC

## 2017-10-11 MED FILL — HYDROCHLOROTHIAZIDE 25 MG T: 25 | 30 days supply | Qty: 30 | Fill #0

## 2017-10-11 MED FILL — cloNIDine HCL 0.3 MG TABS: 0.3 | 30 days supply | Qty: 60 | Fill #0

## 2017-10-11 MED FILL — CARVEDILOL 12.5 MG TABLET: 12.5 | 30 days supply | Qty: 60 | Fill #0

## 2017-10-11 MED FILL — METFORMIN HCL ER 500 MG TAB: 500 | 30 days supply | Qty: 30 | Fill #0

## 2017-10-11 MED FILL — AMLODIPINE BESYLATE 5 MG TA: 5 | 30 days supply | Qty: 30 | Fill #0

## 2017-10-11 NOTE — Progress Notes (Signed)
Subjective:  Patient ID: Nicholas Case, male    DOB: 1960/12/06  Age: 57 y.o. MRN: [id number]  CC: HTN f/u  HPI Nicholas Case is a 57 y.o. male with a medical history of HTN, obesity, and prediabetes presents on HTN f/u. Last BP 153/109 mmHg. Had all his antihypertensive medications refilled. BP 163/94 mmHg today. Says he took his anti-hypertensives approximately 20 minutes ago. Aside from ingrowing right toe nail, pt does not endorse any symptoms or complaints. Requests refills before his medications run out.      Outpatient Medications Prior to Visit  Medication Sig Dispense Refill  . amLODipine (NORVASC) 5 MG tablet Take 1 tablet (5 mg total) by mouth daily. 90 tablet 1  . aspirin 81 MG EC tablet Take 1 tablet (81 mg total) by mouth daily. 90 tablet 3  . atorvastatin (LIPITOR) 40 MG tablet Take 1 tablet (40 mg total) by mouth daily. 90 tablet 3  . carvedilol (COREG) 12.5 MG tablet Take 1 tablet (12.5 mg total) by mouth 2 (two) times daily with a meal. 180 tablet 1  . cloNIDine (CATAPRES) 0.3 MG tablet Take 1 tablet (0.3 mg total) by mouth 2 (two) times daily. 180 tablet 1  . hydrochlorothiazide (HYDRODIURIL) 25 MG tablet Take 1 tablet (25 mg total) by mouth daily. 90 tablet 1  . metFORMIN (GLUCOPHAGE-XR) 500 MG 24 hr tablet Take 1 tablet (500 mg total) by mouth daily with breakfast. 90 tablet 1   No facility-administered medications prior to visit.      ROS Review of Systems  Constitutional: Negative for chills, fever and malaise/fatigue.  Eyes: Negative for blurred vision.  Respiratory: Negative for shortness of breath.   Cardiovascular: Negative for chest pain and palpitations.  Gastrointestinal: Negative for abdominal pain and nausea.  Genitourinary: Negative for dysuria and hematuria.  Musculoskeletal: Negative for joint pain and myalgias.  Skin: Negative for rash.       Ingrowing toe nail  Neurological: Negative for tingling and headaches.  Psychiatric/Behavioral:  Negative for depression. The patient is not nervous/anxious.     Objective:  BP (!) 163/94 (BP Location: Left Arm, Patient Position: Sitting, Cuff Size: Large)   Pulse (!) 59   Temp 97.9 F (36.6 C) (Oral)   Ht 6' (1.829 m)   Wt 222 lb 3.2 oz (100.8 kg)   SpO2 99%   BMI 30.14 kg/m   Vitals:   10/11/17 0840 10/11/17 0934  BP: (!) 163/94 121/80  Pulse: (!) 59 (!) 52  Temp: 97.9 F (36.6 C)   TempSrc: Oral   SpO2: 99%   Weight: 222 lb 3.2 oz (100.8 kg)   Height: 6' (1.829 m)        Physical Exam  Constitutional: He is oriented to person, place, and time.  Well developed, well nourished, NAD, polite  HENT:  Head: Normocephalic and atraumatic.  Eyes: No scleral icterus.  Neck: Normal range of motion. Neck supple. No thyromegaly present.  Cardiovascular: Normal rate, regular rhythm and normal heart sounds.  No LE edema bilaterally  Pulmonary/Chest: Effort normal and breath sounds normal.  Musculoskeletal: He exhibits no edema.  Neurological: He is alert and oriented to person, place, and time.  Skin: Skin is warm and dry. No rash noted. No erythema. No pallor.  Psychiatric: He has a normal mood and affect. His behavior is normal. Thought content normal.  Vitals reviewed.    Assessment & Plan:   1. Essential hypertension - Refill hydrochlorothiazide (HYDRODIURIL) 25 MG tablet; Take 1  tablet (25 mg total) by mouth daily.  Dispense: 30 tablet; Refill: 5 - Refill cloNIDine (CATAPRES) 0.3 MG tablet; Take 1 tablet (0.3 mg total) by mouth 2 (two) times daily.  Dispense: 60 tablet; Refill: 5 - Refill carvedilol (COREG) 12.5 MG tablet; Take 1 tablet (12.5 mg total) by mouth 2 (two) times daily with a meal.  Dispense: 60 tablet; Refill: 5 - Refill amLODipine (NORVASC) 5 MG tablet; Take 1 tablet (5 mg total) by mouth daily.  Dispense: 30 tablet; Refill: 5  2. Elevated lipoprotein(a) - Refill atorvastatin (LIPITOR) 40 MG tablet; Take 1 tablet (40 mg total) by mouth daily.   Dispense: 30 tablet; Refill: 5  3. Ingrowing nail, right great toe - Ambulatory referral to Podiatry   Meds ordered this encounter  Medications  . metFORMIN (GLUCOPHAGE-XR) 500 MG 24 hr tablet    Sig: Take 1 tablet (500 mg total) by mouth daily with breakfast.    Dispense:  30 tablet    Refill:  5    Order Specific Question:   Supervising Provider    Answer:   Hoy Register [[id number]]  . hydrochlorothiazide (HYDRODIURIL) 25 MG tablet    Sig: Take 1 tablet (25 mg total) by mouth daily.    Dispense:  30 tablet    Refill:  5    Order Specific Question:   Supervising Provider    Answer:   Hoy Register [[id number]]  . cloNIDine (CATAPRES) 0.3 MG tablet    Sig: Take 1 tablet (0.3 mg total) by mouth 2 (two) times daily.    Dispense:  60 tablet    Refill:  5    Order Specific Question:   Supervising Provider    Answer:   Hoy Register [[id number]]  . carvedilol (COREG) 12.5 MG tablet    Sig: Take 1 tablet (12.5 mg total) by mouth 2 (two) times daily with a meal.    Dispense:  60 tablet    Refill:  5    Order Specific Question:   Supervising Provider    Answer:   Hoy Register [[id number]]  . atorvastatin (LIPITOR) 40 MG tablet    Sig: Take 1 tablet (40 mg total) by mouth daily.    Dispense:  30 tablet    Refill:  5    Order Specific Question:   Supervising Provider    Answer:   Hoy Register [[id number]]  . amLODipine (NORVASC) 5 MG tablet    Sig: Take 1 tablet (5 mg total) by mouth daily.    Dispense:  30 tablet    Refill:  5    Order Specific Question:   Supervising Provider    Answer:   Hoy Register [[id number]]    Follow-up: Return in about 4 months (around 02/10/2018) for HTN.   Loletta Specter PA

## 2017-10-11 NOTE — Patient Instructions (Signed)
Ingrown Toenail An ingrown toenail occurs when the corner or sides of your toenail grow into the surrounding skin. The big toe is most commonly affected, but it can happen to any of your toes. If your ingrown toenail is not treated, you will be at risk for infection. What are the causes? This condition may be caused by:  Wearing shoes that are too small or tight.  Injury or trauma, such as stubbing your toe or having your toe stepped on.  Improper cutting or care of your toenails.  Being born with (congenital) nail or foot abnormalities, such as having a nail that is too big for your toe.  What increases the risk? Risk factors for an ingrown toenail include:  Age. Your nails tend to thicken as you get older, so ingrown nails are more common in older people.  Diabetes.  Cutting your toenails incorrectly.  Blood circulation problems.  What are the signs or symptoms? Symptoms may include:  Pain, soreness, or tenderness.  Redness.  Swelling.  Hardening of the skin surrounding the toe.  Your ingrown toenail may be infected if there is fluid, pus, or drainage. How is this diagnosed? An ingrown toenail may be diagnosed by medical history and physical exam. If your toenail is infected, your health care provider may test a sample of the drainage. How is this treated? Treatment depends on the severity of your ingrown toenail. Some ingrown toenails may be treated at home. More severe or infected ingrown toenails may require surgery to remove all or part of the nail. Infected ingrown toenails may also be treated with antibiotic medicines. Follow these instructions at home:  If you were prescribed an antibiotic medicine, finish all of it even if you start to feel better.  Soak your foot in warm soapy water for 20 minutes, 3 times per day or as directed by your health care provider.  Carefully lift the edge of the nail away from the sore skin by wedging a small piece of cotton under  the corner of the nail. This may help with the pain. Be careful not to cause more injury to the area.  Wear shoes that fit well. If your ingrown toenail is causing you pain, try wearing sandals, if possible.  Trim your toenails regularly and carefully. Do not cut them in a curved shape. Cut your toenails straight across. This prevents injury to the skin at the corners of the toenail.  Keep your feet clean and dry.  If you are having trouble walking and are given crutches by your health care provider, use them as directed.  Do not pick at your toenail or try to remove it yourself.  Take medicines only as directed by your health care provider.  Keep all follow-up visits as directed by your health care provider. This is important. Contact a health care provider if:  Your symptoms do not improve with treatment. Get help right away if:  You have red streaks that start at your foot and go up your leg.  You have a fever.  You have increased redness, swelling, or pain.  You have fluid, blood, or pus coming from your toenail. This information is not intended to replace advice given to you by your health care provider. Make sure you discuss any questions you have with your health care provider. Document Released: 01/21/2000 Document Revised: 06/25/2015 Document Reviewed: 12/17/2013 Elsevier Interactive Patient Education  2018 [employer]  

## 2017-11-07 ENCOUNTER — Ambulatory Visit: Payer: BLUE CROSS/BLUE SHIELD | Admitting: Podiatry

## 2017-11-07 ENCOUNTER — Encounter: Payer: Self-pay | Admitting: Podiatry

## 2017-11-07 VITALS — BP 103/65 | HR 85

## 2017-11-07 DIAGNOSIS — B351 Tinea unguium: Secondary | ICD-10-CM | POA: Diagnosis not present

## 2017-11-07 DIAGNOSIS — M79676 Pain in unspecified toe(s): Secondary | ICD-10-CM

## 2017-11-10 NOTE — Progress Notes (Signed)
   SUBJECTIVE Patient presents to office today complaining of elongated, thickened nails that cause pain while ambulating in shoes. He is unable to trim his own nails. He also reports ingrowing nails of digits 1 and 2 of the right foot. Applying pressure to the toes increases the pain. He has been soaking the toes for treatment. Patient is here for further evaluation and treatment.  Past Medical History:  Diagnosis Date  . Hypertension     OBJECTIVE General Patient is awake, alert, and oriented x 3 and in no acute distress. Derm Skin is dry and supple bilateral. Negative open lesions or macerations. Remaining integument unremarkable. Nails are tender, long, thickened and dystrophic with subungual debris, consistent with onychomycosis, 1-5 bilateral. No signs of infection noted. Incurvated nails noted to digits 1 and 2 of the right foot.  Vasc  DP and PT pedal pulses palpable bilaterally. Temperature gradient within normal limits.  Neuro Epicritic and protective threshold sensation grossly intact bilaterally.  Musculoskeletal Exam No symptomatic pedal deformities noted bilateral. Muscular strength within normal limits.  ASSESSMENT 1. Onychodystrophic nails 1-5 bilateral with hyperkeratosis of nails.  2. Onychomycosis of nail due to dermatophyte bilateral 3. Incurvated nails digits 1 and 2 of the right foot   PLAN OF CARE 1. Patient evaluated today.  2. Instructed to maintain good pedal hygiene and foot care.  3. Mechanical debridement of nails 1-5 bilaterally performed using a nail nipper. Filed with dremel without incident.  4. Recommended routine pedicures monthly.  5. Return to clinic as needed.    Felecia Shelling, DPM [facility]  Dr. Felecia Shelling, DPM    [address]                                        [place], [place] [address]                Office [phone]  Fax [phone]

## 2017-11-13 MED FILL — cloNIDine HCL 0.3 MG TABS: 0.3 | 30 days supply | Qty: 60 | Fill #1

## 2017-11-13 MED FILL — CARVEDILOL 12.5 MG TABLET: 12.5 | 30 days supply | Qty: 60 | Fill #1

## 2017-11-13 MED FILL — HYDROCHLOROTHIAZIDE 25 MG T: 25 | 30 days supply | Qty: 30 | Fill #1

## 2017-11-13 MED FILL — AMLODIPINE BESYLATE 5 MG TA: 5 | 30 days supply | Qty: 30 | Fill #1

## 2017-12-12 MED FILL — AMLODIPINE BESYLATE 5 MG TA: 5 | 30 days supply | Qty: 30 | Fill #2

## 2017-12-12 MED FILL — cloNIDine HCL 0.3 MG TABS: 0.3 | 30 days supply | Qty: 60 | Fill #2

## 2017-12-12 MED FILL — HYDROCHLOROTHIAZIDE 25 MG T: 25 | 30 days supply | Qty: 30 | Fill #2

## 2017-12-12 MED FILL — CARVEDILOL 12.5 MG TABLET: 12.5 | 30 days supply | Qty: 60 | Fill #2

## 2018-01-16 MED FILL — HYDROCHLOROTHIAZIDE 25 MG T: 25 | 30 days supply | Qty: 30 | Fill #3

## 2018-01-16 MED FILL — cloNIDine HCL 0.3 MG TABS: 0.3 | 30 days supply | Qty: 60 | Fill #3

## 2018-01-16 MED FILL — CARVEDILOL 12.5 MG TABLET: 12.5 | 30 days supply | Qty: 60 | Fill #3

## 2018-01-16 MED FILL — AMLODIPINE BESYLATE 5 MG TA: 5 | 30 days supply | Qty: 30 | Fill #3

## 2018-02-07 ENCOUNTER — Ambulatory Visit (INDEPENDENT_AMBULATORY_CARE_PROVIDER_SITE_OTHER): Payer: BLUE CROSS/BLUE SHIELD | Admitting: Physician Assistant

## 2018-02-18 MED FILL — HYDROCHLOROTHIAZIDE 25 MG T: 25 | 30 days supply | Qty: 30 | Fill #4

## 2018-02-18 MED FILL — AMLODIPINE BESYLATE 5 MG TA: 5 | 30 days supply | Qty: 30 | Fill #4

## 2018-02-18 MED FILL — CARVEDILOL 12.5 MG TABLET: 12.5 | 30 days supply | Qty: 60 | Fill #4

## 2018-02-18 MED FILL — cloNIDine HCL 0.3 MG TABS: 0.3 | 30 days supply | Qty: 60 | Fill #4

## 2018-03-21 MED FILL — CARVEDILOL 12.5 MG TABLET: 12.5 | 30 days supply | Qty: 60 | Fill #5

## 2018-03-21 MED FILL — HYDROCHLOROTHIAZIDE 25 MG T: 25 | 30 days supply | Qty: 30 | Fill #5

## 2018-03-21 MED FILL — cloNIDine HCL 0.3 MG TABS: 0.3 | 30 days supply | Qty: 60 | Fill #5

## 2018-03-21 MED FILL — AMLODIPINE BESYLATE 5 MG TA: 5 | 30 days supply | Qty: 30 | Fill #5

## 2018-04-26 MED FILL — HYDROCHLOROTHIAZIDE 25 MG T: 25 | 90 days supply | Qty: 90 | Fill #0

## 2018-04-26 MED FILL — AMLODIPINE BESYLATE 5 MG TA: 5 | 90 days supply | Qty: 90 | Fill #0

## 2018-04-26 MED FILL — cloNIDine HCL 0.3 MG TABS: 0.3 | 90 days supply | Qty: 180 | Fill #0

## 2018-04-26 MED FILL — CARVEDILOL 12.5 MG TABLET: 12.5 | 90 days supply | Qty: 180 | Fill #0

## 2018-07-24 MED FILL — AMLODIPINE BESYLATE 5 MG TA: 5 | 90 days supply | Qty: 90 | Fill #1

## 2018-07-24 MED FILL — HYDROCHLOROTHIAZIDE 25 MG T: 25 | 90 days supply | Qty: 90 | Fill #1

## 2018-07-24 MED FILL — CARVEDILOL 12.5 MG TABLET: 12.5 | 90 days supply | Qty: 180 | Fill #1

## 2018-07-24 MED FILL — cloNIDine HCL 0.3 MG TABS: 0.3 | 90 days supply | Qty: 180 | Fill #1
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
## 2022-09-11 ENCOUNTER — Emergency Department (HOSPITAL_COMMUNITY): Payer: No Typology Code available for payment source

## 2022-09-11 ENCOUNTER — Observation Stay (HOSPITAL_COMMUNITY)
Admission: EM | Admit: 2022-09-11 | Discharge: 2022-09-12 | Disposition: A | Discharge status: Home / Self Care | Payer: No Typology Code available for payment source | Attending: Internal Medicine | Admitting: Internal Medicine

## 2022-09-11 ENCOUNTER — Emergency Department (HOSPITAL_COMMUNITY): Payer: BLUE CROSS/BLUE SHIELD

## 2022-09-11 ENCOUNTER — Other Ambulatory Visit: Payer: Self-pay

## 2022-09-11 ENCOUNTER — Encounter (HOSPITAL_COMMUNITY): Payer: Self-pay | Admitting: Physician Assistant

## 2022-09-11 DIAGNOSIS — Z79899 Other long term (current) drug therapy: Secondary | ICD-10-CM | POA: Insufficient documentation

## 2022-09-11 DIAGNOSIS — Z6833 Body mass index (BMI) 33.0-33.9, adult: Secondary | ICD-10-CM | POA: Diagnosis not present

## 2022-09-11 DIAGNOSIS — Z7982 Long term (current) use of aspirin: Secondary | ICD-10-CM | POA: Diagnosis not present

## 2022-09-11 DIAGNOSIS — K760 Fatty (change of) liver, not elsewhere classified: Secondary | ICD-10-CM | POA: Insufficient documentation

## 2022-09-11 DIAGNOSIS — I10 Essential (primary) hypertension: Secondary | ICD-10-CM | POA: Diagnosis not present

## 2022-09-11 DIAGNOSIS — K769 Liver disease, unspecified: Secondary | ICD-10-CM | POA: Diagnosis present

## 2022-09-11 DIAGNOSIS — E876 Hypokalemia: Secondary | ICD-10-CM | POA: Diagnosis not present

## 2022-09-11 DIAGNOSIS — E119 Type 2 diabetes mellitus without complications: Secondary | ICD-10-CM

## 2022-09-11 DIAGNOSIS — R001 Bradycardia, unspecified: Secondary | ICD-10-CM | POA: Insufficient documentation

## 2022-09-11 DIAGNOSIS — E669 Obesity, unspecified: Secondary | ICD-10-CM | POA: Diagnosis not present

## 2022-09-11 DIAGNOSIS — R0789 Other chest pain: Principal | ICD-10-CM

## 2022-09-11 DIAGNOSIS — I152 Hypertension secondary to endocrine disorders: Secondary | ICD-10-CM | POA: Diagnosis present

## 2022-09-11 DIAGNOSIS — R079 Chest pain, unspecified: Principal | ICD-10-CM

## 2022-09-11 DIAGNOSIS — E1159 Type 2 diabetes mellitus with other circulatory complications: Secondary | ICD-10-CM | POA: Diagnosis present

## 2022-09-11 HISTORY — DX: Chest pain, unspecified: R07.9

## 2022-09-11 HISTORY — DX: Type 2 diabetes mellitus without complications: E11.9

## 2022-09-11 HISTORY — DX: Bradycardia, unspecified: R00.1

## 2022-09-11 HISTORY — DX: Syphilis, unspecified: A53.9

## 2022-09-11 LAB — CBC WITH DIFFERENTIAL/PLATELET
Abs Immature Granulocytes: 0.02 10*3/uL (ref 0.00–0.07)
Basophils Absolute: 0 10*3/uL (ref 0.0–0.1)
Basophils Relative: 1 %
Eosinophils Absolute: 0.1 10*3/uL (ref 0.0–0.5)
Eosinophils Relative: 2 %
HCT: 42.2 % (ref 39.0–52.0)
Hemoglobin: 13.7 g/dL (ref 13.0–17.0)
Immature Granulocytes: 0 %
Lymphocytes Relative: 31 %
Lymphs Abs: 1.5 10*3/uL (ref 0.7–4.0)
MCH: 28.1 pg (ref 26.0–34.0)
MCHC: 32.5 g/dL (ref 30.0–36.0)
MCV: 86.5 fL (ref 80.0–100.0)
Monocytes Absolute: 0.3 10*3/uL (ref 0.1–1.0)
Monocytes Relative: 6 %
Neutro Abs: 3 10*3/uL (ref 1.7–7.7)
Neutrophils Relative %: 60 %
Platelets: 216 10*3/uL (ref 150–400)
RBC: 4.88 MIL/uL (ref 4.22–5.81)
RDW: 13.9 % (ref 11.5–15.5)
WBC: 5 10*3/uL (ref 4.0–10.5)
nRBC: 0 % (ref 0.0–0.2)

## 2022-09-11 LAB — COMPREHENSIVE METABOLIC PANEL
ALT: 19 U/L (ref 0–44)
AST: 21 U/L (ref 15–41)
Albumin: 3.7 g/dL (ref 3.5–5.0)
Alkaline Phosphatase: 93 U/L (ref 38–126)
Anion gap: 8 (ref 5–15)
BUN: 11 mg/dL (ref 6–20)
CO2: 28 mmol/L (ref 22–32)
Calcium: 9 mg/dL (ref 8.9–10.3)
Chloride: 103 mmol/L (ref 98–111)
Creatinine, Ser: 1.04 mg/dL (ref 0.61–1.24)
GFR, Estimated: >60 mL/min (ref >60)
Glucose, Bld: 116 mg/dL — ABNORMAL HIGH (ref 70–99)
Potassium: 3.7 mmol/L (ref 3.5–5.1)
Sodium: 139 mmol/L (ref 135–145)
Total Bilirubin: 0.3 mg/dL (ref 0.3–1.2)
Total Protein: 7.9 g/dL (ref 6.5–8.1)

## 2022-09-11 LAB — LIPASE, BLOOD: Lipase: 34 U/L (ref 11–51)

## 2022-09-11 LAB — TROPONIN I (HIGH SENSITIVITY)
Troponin I (High Sensitivity): 8 ng/L (ref <18)
Troponin I (High Sensitivity): 6 ng/L (ref <18)

## 2022-09-11 LAB — HIV ANTIBODY (ROUTINE TESTING W REFLEX): HIV Screen 4th Generation wRfx: NONREACTIVE

## 2022-09-11 LAB — GLUCOSE, CAPILLARY: Glucose-Capillary: 158 mg/dL — ABNORMAL HIGH (ref 70–99)

## 2022-09-11 LAB — BRAIN NATRIURETIC PEPTIDE: B Natriuretic Peptide: 19.7 pg/mL (ref 0.0–100.0)

## 2022-09-11 LAB — TSH: TSH: 1.013 u[IU]/mL (ref 0.350–4.500)

## 2022-09-11 MED ORDER — HYDROCHLOROTHIAZIDE 25 MG PO TABS
25.0000 mg | ORAL_TABLET | Freq: Every day | ORAL | Status: DC
  Administered 2022-09-12: 25 mg via ORAL
  Filled 2022-09-11: qty 1

## 2022-09-11 MED ORDER — ONDANSETRON HCL 4 MG PO TABS: 4.0000 mg | ORAL_TABLET | Freq: Four times a day (QID) | ORAL | Status: DC | PRN

## 2022-09-11 MED ORDER — ACETAMINOPHEN 325 MG PO TABS: 650.0000 mg | ORAL_TABLET | Freq: Four times a day (QID) | ORAL | Status: DC | PRN

## 2022-09-11 MED ORDER — ORAL CARE MOUTH RINSE: 15.0000 mL | OROMUCOSAL | Status: DC | PRN

## 2022-09-11 MED ORDER — HYDRALAZINE HCL 20 MG/ML IJ SOLN: 10.0000 mg | INTRAMUSCULAR | Status: DC | PRN

## 2022-09-11 MED ORDER — IRBESARTAN 300 MG PO TABS
150.0000 mg | ORAL_TABLET | Freq: Every day | ORAL | Status: DC
  Administered 2022-09-12: 150 mg via ORAL
  Filled 2022-09-11: qty 1

## 2022-09-11 MED ORDER — INSULIN ASPART 100 UNIT/ML IJ SOLN: 0.0000 [IU] | Freq: Three times a day (TID) | INTRAMUSCULAR | Status: DC

## 2022-09-11 MED ORDER — ENOXAPARIN SODIUM 40 MG/0.4ML IJ SOSY
40.0000 mg | PREFILLED_SYRINGE | INTRAMUSCULAR | Status: DC
  Filled 2022-09-11: qty 0.4

## 2022-09-11 MED ORDER — SENNOSIDES-DOCUSATE SODIUM 8.6-50 MG PO TABS: 1.0000 | ORAL_TABLET | Freq: Every evening | ORAL | Status: DC | PRN

## 2022-09-11 MED ORDER — ASPIRIN 81 MG PO TBEC
81.0000 mg | DELAYED_RELEASE_TABLET | Freq: Every day | ORAL | Status: DC
  Administered 2022-09-12: 81 mg via ORAL
  Filled 2022-09-11: qty 1

## 2022-09-11 MED ORDER — ONDANSETRON HCL 4 MG/2ML IJ SOLN: 4.0000 mg | Freq: Four times a day (QID) | INTRAMUSCULAR | Status: DC | PRN

## 2022-09-11 MED ORDER — AMLODIPINE BESYLATE 5 MG PO TABS
5.0000 mg | ORAL_TABLET | Freq: Every day | ORAL | Status: DC
  Administered 2022-09-12: 5 mg via ORAL
  Filled 2022-09-11: qty 1

## 2022-09-11 MED ORDER — ACETAMINOPHEN 650 MG RE SUPP: 650.0000 mg | Freq: Four times a day (QID) | RECTAL | Status: DC | PRN

## 2022-09-11 MED ORDER — SODIUM CHLORIDE 0.9% FLUSH
3.0000 mL | Freq: Two times a day (BID) | INTRAVENOUS | Status: DC
  Administered 2022-09-11 – 2022-09-12 (×2): 3 mL via INTRAVENOUS

## 2022-09-11 NOTE — Consult Note (Addendum)
Cardiology Consultation   Patient ID: Nicholas Case MRN: [id number]; DOB: Feb 10, 1960   Admission date: 09/11/2022  PCP:  Loletta Specter, [name]   Nicholas Case Providers Cardiologist:  New to [facility]     Chief Complaint:  chest pain  Patient Profile:   Nicholas Case is a 62 y.o. male with hypertension, DM2, and occasional marijuana use who is being seen 09/11/2022 for the evaluation of dizziness, chest discomfort and bradycardia.  History of Present Illness:   Mr. Adair is a 62 year old male with past medical history of hypertension, DM2, and occasional marijuana use.  He says his father had high blood pressure, seizure and passed away with enlarged heart.  Mother is healthy and living.  He has been diagnosed with hypertension in the past 5 to 6 years.  He has intermittent chest discomfort around the same time.  In the past several years, his chest discomfort has increased in frequency and duration.  He described the chest discomfort as a diffuse chest pain that originated on the right side of the chest and radiated to the left side.  It typically occur while he is doing something however may also occur when he is laughing or turning his body.  He says occasionally he has to stop walking because the chest discomfort.  He described dyspnea on exertion as well.  He is currently a Production designer, theatre/television/film at [employer].  In the past several months, he has been noticing some dizzy spell.  The dizzy spell was not exacerbated by body position changes.  He says he was dizzy yesterday while standing behind the register.  Yesterday's episode was the only time where he felt he might pass out.  He is on amlodipine 10 mg daily, HCTZ 25 mg daily and metoprolol succinate 50 mg daily for blood pressure control.  He takes all of his blood pressure medication in the morning.  He is also on aspirin and metformin.  He says he was recently diagnosed with syphilis however has not gone to the health  department to get treated.  He has not had any sexual intercourse since the diagnosis.  He divorced 15 years ago.  Patient also complains of occasional right upper quadrant abdominal pain.  He went to his PCPs office for regular follow-up this morning and mentioned the recent dizzy spell and chest discomfort to his PCP.  EKG obtained at his PCP office showed sinus bradycardia with heart rate of 50 and T wave inversion in V5 and V6.  He was subsequently sent to [facility] ED for further evaluation.  At this time, he is chest pain-free.  Telemetry shows his heart rate is mostly between 45 to 52 bpm.  During the interview, he was resting comfortably in bed.   Past Medical History:  Diagnosis Date   DM II (diabetes mellitus, type II), controlled (HCC)    Hypertension    Syphilis     Past Surgical History:  Procedure Laterality Date   NECK SURGERY       Medications Prior to Admission: Prior to Admission medications   Medication Sig Start Date End Date Taking? Authorizing Provider  amLODipine (NORVASC) 5 MG tablet Take 1 tablet (5 mg total) by mouth daily. 10/11/17   Loletta Specter, [name]  aspirin 81 MG EC tablet Take 1 tablet (81 mg total) by mouth daily. 09/11/17   Loletta Specter, [name]  atorvastatin (LIPITOR) 40 MG tablet Take 1 tablet (40 mg total) by mouth daily. Patient not  taking: Reported on 11/07/2017 10/11/17   Loletta Specter, [name]  carvedilol (COREG) 12.5 MG tablet Take 1 tablet (12.5 mg total) by mouth 2 (two) times daily with a meal. 10/11/17   Loletta Specter, [name]  cloNIDine (CATAPRES) 0.3 MG tablet Take 1 tablet (0.3 mg total) by mouth 2 (two) times daily. 10/11/17   Loletta Specter, [name]  hydrochlorothiazide (HYDRODIURIL) 25 MG tablet Take 1 tablet (25 mg total) by mouth daily. 10/11/17   Loletta Specter, [name]  metFORMIN (GLUCOPHAGE-XR) 500 MG 24 hr tablet Take 1 tablet (500 mg total) by mouth daily with breakfast. Patient not taking: Reported on 11/07/2017 10/11/17   Loletta Specter, [name]     Allergies:    Allergies  Allergen Reactions   Ace Inhibitors     REACTION: cough    Social History:   Social History   Socioeconomic History   Marital status: Single    Spouse name: Not on file   Number of children: Not on file   Years of education: Not on file   Highest education level: Not on file  Occupational History   Not on file  Tobacco Use   Smoking status: Never   Smokeless tobacco: Current  Substance and Sexual Activity   Alcohol use: Yes    Comment: rare use of beers   Drug use: Yes    Types: Marijuana   Sexual activity: Not on file  Other Topics Concern   Not on file  Social History Narrative   Not on file   Social Determinants of Health   Financial Resource Strain: Not at Risk (05/01/2022)   Received from [employer]    Financial Resource Strain: 1  Food Insecurity: Not at Risk (05/01/2022)   Received from [facility] Insecurity    Food: 1  Transportation Needs: Not at Risk (05/01/2022)   Received from [employer] Needs    Transportation: 1  Physical Activity: Not on File (04/10/2022)   Received from [facility]   Physical Activity    Physical Activity: 0  Stress: Not on File (04/10/2022)   Received from [facility]   Stress    Stress: 0  Social Connections: Not on File (04/10/2022)   Received from [facility]   Social Connections    Social Connections and Isolation: 0  Intimate Partner Violence: Not on file    Family History:   The patient's family history includes Heart failure in his father; Hypertension in his father.    ROS:  Please see the history of present illness.  All other ROS reviewed and negative.     Physical Exam/Data:   Vitals:   09/11/22 1206 09/11/22 1417 09/11/22 1515  BP: (!) 153/91 (!) 139/104 (!) 180/111  Pulse: (!) 56 (!) 47 (!) 51  Resp: 19 16 20   Temp: 98.1 F (36.7 C) 98 F (36.7 C)   TempSrc: Oral Oral   SpO2: 100% 100% 100%   No intake or output data in the 24 hours  ending 09/11/22 1550    10/11/2017    8:40 AM 09/11/2017    8:33 AM 08/13/2017    9:51 AM  Last 3 Weights  Weight (lbs) 222 lb 3.2 oz 228 lb 6.4 oz 240 lb  Weight (kg) 100.789 kg 103.602 kg 108.863 kg     There is no height or weight on file to calculate BMI.  General:  Well nourished, well developed, in no acute distress HEENT: normal  Neck: no JVD Vascular: No carotid bruits; Distal pulses 2+ bilaterally   Cardiac:  normal S1, S2; RRR; no murmur  Lungs:  clear to auscultation bilaterally, no wheezing, rhonchi or rales  Abd: soft, nontender, no hepatomegaly  Ext: no edema Musculoskeletal:  No deformities, BUE and BLE strength normal and equal Skin: warm and dry  Neuro:  CNs 2-12 intact, no focal abnormalities noted Psych:  Normal affect    EKG:  The ECG that was done and was personally reviewed and demonstrates sinus bradycardia with HR 50 bpm  Relevant CV Studies:  N/A  Laboratory Data:  High Sensitivity Troponin:   Recent Labs  Lab 09/11/22 1230 09/11/22 1416  TROPONINIHS 8 6      Chemistry Recent Labs  Lab 09/11/22 1230  NA 139  K 3.7  CL 103  CO2 28  GLUCOSE 116*  BUN 11  CREATININE 1.04  CALCIUM 9.0  GFRNONAA >60  ANIONGAP 8    Recent Labs  Lab 09/11/22 1230  PROT 7.9  ALBUMIN 3.7  AST 21  ALT 19  ALKPHOS 93  BILITOT 0.3   Lipids No results for input(s): "CHOL", "TRIG", "HDL", "LABVLDL", "LDLCALC", "CHOLHDL" in the last 168 hours. Hematology Recent Labs  Lab 09/11/22 1230  WBC 5.0  RBC 4.88  HGB 13.7  HCT 42.2  MCV 86.5  MCH 28.1  MCHC 32.5  RDW 13.9  PLT 216   Thyroid No results for input(s): "TSH", "FREET4" in the last 168 hours. BNP Recent Labs  Lab 09/11/22 1409  BNP 19.7    DDimer No results for input(s): "DDIMER" in the last 168 hours.   Radiology/Studies:  DG Chest Portable 1 View  Result Date: 09/11/2022 CLINICAL DATA:  Chest pain EXAM: PORTABLE CHEST 1 VIEW COMPARISON:  03/11/2006 x-ray FINDINGS: Borderline size  heart. No consolidation, pneumothorax or effusion. No edema. Overlapping cardiac leads. IMPRESSION: Borderline size heart.  No acute cardiopulmonary disease. Electronically Signed   By: Karen Kays M.D.   On: 09/11/2022 14:47     Assessment and Plan:   Chest pain: Symptom has been intermittent for the past 5 to 6 years, however has been increasing frequency and duration.  Trigger would include physical exertion, laughing and body rotation.  Patient has both typical and atypical features, will discuss with MD, obtain echocardiogram, possible workup would include either coronary CT versus cardiac catheterization.  Intermittent dizziness with sinus bradycardia: dizziness not brought on by body position changes. On metoprolol succinate 50 mg daily.  Will stop metoprolol succinate and replace with ARB.  History of cough on ACE inhibitor.  Right upper quadrant abdominal pain: Has been going on for a while, positive Murphy sign was physical exam.  Consider right upper quadrant abdominal ultrasound.  Hypertension: On amlodipine, hydrochlorothiazide and Toprol-XL at home.  Will discontinue Toprol-XL given sinus bradycardia, replace with ARB.  DM2: On metformin  Occasional marijuana use  Syphilis: Per patient, he was recently diagnosed with syphilis, however has failed to go to the health department to get treated. Per primary team   Risk Assessment/Risk Scores:    For questions or updates, please contact [facility] Please consult [email or web address] for contact info under     Signed, Azalee Course, PA  09/11/2022 3:50 PM    Agree with note by Azalee Course [name]  Asked to see this 62 year old African-American male for atypical chest pain and dizziness.  He has a history of hypertension last 5 to 6 years, untreated recently diagnosed diabetes.  There  is also question of syphilis.  He complains of chest pain for the last several years that occurs both with exertion and at rest with various positions.   Also gets dizzy on a daily basis but has had no true syncope.  He has been on a beta-blocker for hypertension and runs heart rates in the high 40s and low 50s.  His exam otherwise is benign.  Vital signs are stable.  His EKG shows sinus bradycardia with LVH and repolarization changes.  His chest pain does not sound ischemically mediated.  I am going to get a 2D echo and a coronary CTA to further evaluate.  I recommend stopping his beta-blocker and starting him on an ARB.   Runell Gess, M.D., FACP, [facility], [facility] [address] [place], [place]  [address]  [phone] 09/11/2022 4:36 PM

## 2022-09-11 NOTE — ED Provider Notes (Signed)
EMERGENCY DEPARTMENT AT [facility] Provider Note   CSN: [id number] Arrival date & time: 09/11/22  1159     History  Chief Complaint  Patient presents with   Chest Pain    Nicholas Case is a 62 y.o. male with a past medical history significant for hypertension, type 2 diabetes, currently being treated for syphilis who presents to the ED due to chest pain.  Patient notes chest pain has been an ongoing issue for numerous years however, has slightly worsened over the past few days.  Chest pain is typically exertional associated with shortness of breath and occasional dizziness.  However chest pain can also occur at rest.  No cardiac history.  Patient was seen by PCP for a checkup today and was found to be bradycardic.  Patient was sent via EMS to the ED due to active chest pain.  Patient is currently on metoprolol. Patient received ASA prior to arrival. Patient is currently chest pain free.   History obtained from patient and past medical records. No interpreter used during encounter.       Home Medications Prior to Admission medications   Medication Sig Start Date End Date Taking? Authorizing Provider  amLODipine (NORVASC) 5 MG tablet Take 1 tablet (5 mg total) by mouth daily. 10/11/17   Loletta Specter, [name]  aspirin 81 MG EC tablet Take 1 tablet (81 mg total) by mouth daily. 09/11/17   Loletta Specter, [name]  atorvastatin (LIPITOR) 40 MG tablet Take 1 tablet (40 mg total) by mouth daily. Patient not taking: Reported on 11/07/2017 10/11/17   Loletta Specter, [name]  carvedilol (COREG) 12.5 MG tablet Take 1 tablet (12.5 mg total) by mouth 2 (two) times daily with a meal. 10/11/17   Loletta Specter, [name]  cloNIDine (CATAPRES) 0.3 MG tablet Take 1 tablet (0.3 mg total) by mouth 2 (two) times daily. 10/11/17   Loletta Specter, [name]  hydrochlorothiazide (HYDRODIURIL) 25 MG tablet Take 1 tablet (25 mg total) by mouth daily. 10/11/17   Loletta Specter, [name]  metFORMIN  (GLUCOPHAGE-XR) 500 MG 24 hr tablet Take 1 tablet (500 mg total) by mouth daily with breakfast. Patient not taking: Reported on 11/07/2017 10/11/17   Loletta Specter, [name]      Allergies    Ace inhibitors    Review of Systems   Review of Systems  Respiratory:  Positive for shortness of breath.   Cardiovascular:  Positive for chest pain.  Neurological:  Positive for dizziness.    Physical Exam Updated Vital Signs BP (!) 139/104   Pulse (!) 47   Temp 98 F (36.7 C) (Oral)   Resp 16   SpO2 100%  Physical Exam Vitals and nursing note reviewed.  Constitutional:      General: He is not in acute distress.    Appearance: He is not ill-appearing.  HENT:     Head: Normocephalic.  Eyes:     Pupils: Pupils are equal, round, and reactive to light.  Cardiovascular:     Rate and Rhythm: Regular rhythm. Bradycardia present.     Pulses: Normal pulses.     Heart sounds: Normal heart sounds. No murmur heard.    No friction rub. No gallop.     Comments: HR low 50s on monitor Pulmonary:     Effort: Pulmonary effort is normal.     Breath sounds: Normal breath sounds.  Abdominal:     General: Abdomen is flat. There is no distension.  Palpations: Abdomen is soft.     Tenderness: There is no abdominal tenderness. There is no guarding or rebound.  Musculoskeletal:        General: Normal range of motion.     Cervical back: Neck supple.     Comments: 1+ pitting edema bilaterally at shins.   Skin:    General: Skin is warm and dry.  Neurological:     General: No focal deficit present.     Mental Status: He is alert.  Psychiatric:        Mood and Affect: Mood normal.        Behavior: Behavior normal.     ED Results / Procedures / Treatments   Labs (all labs ordered are listed, but only abnormal results are displayed) Labs Reviewed  COMPREHENSIVE METABOLIC PANEL - Abnormal; Notable for the following components:      Result Value   Glucose, Bld 116 (*)    All other components  within normal limits  CBC WITH DIFFERENTIAL/PLATELET  BRAIN NATRIURETIC PEPTIDE  LIPASE, BLOOD  TSH  TROPONIN I (HIGH SENSITIVITY)  TROPONIN I (HIGH SENSITIVITY)    EKG EKG Interpretation Date/Time:  Monday September 11 2022 12:15:18 EDT Ventricular Rate:  50 PR Interval:  164 QRS Duration:  90 QT Interval:  430 QTC Calculation: 392 R Axis:   63  Text Interpretation: Sinus bradycardia No significant change since last tracing Abnormal ECG When compared with ECG of 22-Dec-2008 11:02, PREVIOUS ECG IS PRESENT Confirmed by Kommor, Madison ([id number]) on 09/11/2022 2:06:55 PM  Radiology DG Chest Portable 1 View  Result Date: 09/11/2022 CLINICAL DATA:  Chest pain EXAM: PORTABLE CHEST 1 VIEW COMPARISON:  03/11/2006 x-ray FINDINGS: Borderline size heart. No consolidation, pneumothorax or effusion. No edema. Overlapping cardiac leads. IMPRESSION: Borderline size heart.  No acute cardiopulmonary disease. Electronically Signed   By: Karen Kays M.D.   On: 09/11/2022 14:47    Procedures Procedures    Medications Ordered in ED Medications - No data to display  ED Course/ Medical Decision Making/ A&P                                 Medical Decision Making Amount and/or Complexity of Data Reviewed Independent Historian: EMS External Data Reviewed: notes.    Details: PCP note from today Labs: ordered. Decision-making details documented in ED Course. Radiology: ordered and independent interpretation performed. Decision-making details documented in ED Course. ECG/medicine tests: ordered and independent interpretation performed. Decision-making details documented in ED Course.   This patient presents to the ED for concern of CP, this involves an extensive number of treatment options, and is a complaint that carries with it a high risk of complications and morbidity.  The differential diagnosis includes ACS, PE, aortic dissection, MSK etiology, etc  62 year old male presents to the ED due to chest  pain, shortness of breath, and dizziness.  Patient seen at PCP office prior to arrival and sent to the ED due to bradycardia.  Patient given ASA prior to arrival.  Patient is currently chest pain-free during initial evaluation.  Notes this chest pain is typically exertional however, sometimes occurs at rest.  On metoprolol.  Upon arrival patient bradycardic at 56, occasionally drops in the low 40s.  Patient in no acute distress.  1+ pitting edema to shins bilaterally.  Lungs clear to auscultation bilaterally.  Routine labs ordered.  Cardiac labs to rule out ACS.  Chest x-ray.  Given  symptomatic bradycardia, will discuss with cardiology for further evaluation. May need to change metoprolol dose?  CBC unremarkable.  No leukocytosis.  Normal hemoglobin.  CMP reassuring.  Normal renal function.  No major electrolyte derangements.  Mild hyperglycemia 116.  Initial troponin normal.  EKG demonstrates sinus bradycardia.  Some nonspecific T wave abnormalities.  2:28 PM Spoke to [facility] with Cardiology who notes cardiology will consult on patient to determine disposition.  Patient handed off to [facility], [name] at shift change pending cardiology recommendations and disposition.       Final Clinical Impression(s) / ED Diagnoses Final diagnoses:  Atypical chest pain  Bradycardia    Rx / DC Orders ED Discharge Orders          Ordered    Ambulatory referral to Cardiology        09/11/22 [address] 09/11/22 1454    Glendora Score, MD 09/11/22 2028

## 2022-09-11 NOTE — H&P (Signed)
History and Physical    Presten Beh UJW:[id number] DOB: [birth year] DOA: 09/11/2022  PCP: Loletta Specter, [name]  Patient coming from: Home  I have personally briefly reviewed patient's old medical records in [facility]  Chief Complaint: Chest pain  HPI: Nicholas Case is a 62 y.o. male with medical history significant for T2DM, HTN who presented to the ED for evaluation of chest pain.  Patient states he went to his PCP earlier today for regular checkup.  He was noted to be bradycardic with some mild dizziness and sent to the ED for further evaluation.  He had reported intermittent chest pain as well.  Patient states that he he does have intermittent sharp pain across his chest which can occur with exertion or even at rest.  Sometimes the pain is severe enough for he doubles over and has to go to the ground.  He says this pain also sometimes occurs in his left or right upper abdominal regions.  He says when the pain is very severe he has set massage the area before it improves.  He says he does lift some boxes for work but states that these are not very heavy.  He sometimes will break out in sweats or feel short of breath during these episodes.  He has not had any peripheral edema.  Patient does take metoprolol for blood pressure.  He has not had any recent medication changes.  He is not aware of any heart disease in his immediate family members.  He reports occasional marijuana use and will have a beer on occasion.  He otherwise denies any tobacco use, cocaine use, injection drug use.  ED Course  Labs/Imaging on admission: I have personally reviewed following labs and imaging studies.  Initial vitals showed BP 153/91, pulse 56, RR 19, temp 98.1 F, SpO2 100% on room air.  Labs show WBC 5.0, hemoglobin 13.7, platelets 216,000, sodium 139, potassium 3.7, bicarb 28, BUN 11, creatinine 1.04, serum glucose 116, LFTs within normal limits, BNP 19.7, troponin 8 > 6, lipase 34, TSH  1.013.  Portable chest x-ray shows borderline cardiomegaly, no focal consolidation, effusion, or edema.  RUQ ultrasound negative for gallstones or ductal dilatation.  Fatty liver infiltration with some hypoechoic lesions noted.  These are not clearly simple cysts.  Cardiology were consulted and recommended medical admission.  They have discontinued beta-blocker and ordered echocardiogram and coronary CTA.  The hospitalist service was consulted to admit for further evaluation and management.  Review of Systems: All systems reviewed and are negative except as documented in history of present illness above.   Past Medical History:  Diagnosis Date   DM II (diabetes mellitus, type II), controlled (HCC)    Hypertension    Syphilis     Past Surgical History:  Procedure Laterality Date   NECK SURGERY      Social History:  reports that he has never smoked. He uses smokeless tobacco. He reports current alcohol use. He reports current drug use. Drug: Marijuana.  Allergies  Allergen Reactions   Ace Inhibitors Cough    Family History  Problem Relation Age of Onset   Hypertension Father    Heart failure Father      Prior to Admission medications   Medication Sig Start Date End Date Taking? Authorizing Provider  amLODipine (NORVASC) 5 MG tablet Take 1 tablet (5 mg total) by mouth daily. 10/11/17  Yes Loletta Specter, [name]  aspirin 81 MG EC tablet Take 1 tablet (81 mg total) by  mouth daily. 09/11/17  Yes Loletta Specter, [name]  ELDERBERRY PO Take 1 capsule by mouth daily as needed (immune support).   Yes [provider]  ergocalciferol (VITAMIN D2) 1.25 MG (50000 UT) capsule Take 50,000 Units by mouth once a week.   Yes [provider]  hydrochlorothiazide (HYDRODIURIL) 25 MG tablet Take 1 tablet (25 mg total) by mouth daily. 10/11/17  Yes Loletta Specter, [name]  metoprolol succinate (TOPROL-XL) 25 MG 24 hr tablet Take 50 mg by mouth daily.   Yes [provider]    Physical Exam: Vitals:   09/11/22 1630 09/11/22 1819 09/11/22 1830 09/11/22 1922  BP: (!) 155/96  (!) 187/163 (!) 144/92  Pulse: (!) 50 (!) 51 74 (!) 50  Resp: 15 13 17  (!) 21  Temp:  98.1 F (36.7 C)    TempSrc:  Oral    SpO2: 99% 100% 99% 99%   Constitutional: Resting in bed, NAD, calm, comfortable Eyes: EOMI, lids and conjunctivae normal ENMT: Mucous membranes are moist. Posterior pharynx clear of any exudate or lesions.Normal dentition.  Neck: normal, supple, no masses. Respiratory: clear to auscultation bilaterally, no wheezing, no crackles. Normal respiratory effort. No accessory muscle use.  Cardiovascular: Bradycardic, no murmurs / rubs / gallops. No extremity edema. 2+ pedal pulses. Abdomen: no tenderness, no masses palpated.  Musculoskeletal: no clubbing / cyanosis. No joint deformity upper and lower extremities. Good ROM, no contractures. Normal muscle tone.  Skin: no rashes, lesions, ulcers. No induration Neurologic: Sensation intact. Strength 5/5 in all 4.  Psychiatric: Normal judgment and insight. Alert and oriented x 3. Normal mood.   EKG: Personally reviewed.  Sinus bradycardia, rate 50.  TWI V4-5 which was present on remote EKG in 2010.  Assessment/Plan Principal Problem:   Chest pain Active Problems:   Sinus bradycardia   Hypertension associated with diabetes (HCC)   Type 2 diabetes mellitus (HCC)   Liver lesion   Nicholas Case is a 62 y.o. male with medical history significant for T2DM, HTN who is admitted for chest pain evaluation.  Assessment and Plan: Chest pain: Intermittent mixed typical and atypical chest pain.  Troponin negative x 2.  EKG shows sinus bradycardia.  Cardiology following. -Coronary CT ordered for a.m. -Echocardiogram ordered -Continue aspirin 81 mg daily -Keep on telemetry overnight  Sinus bradycardia: Sinus bradycardia with heart rate 40-50s on arrival.  Has had mild dizziness, no syncope.  Metoprolol discontinued and he  has been started on ARB per cardiology.  Hypertension: Continue HCTZ.  Toprol-XL discontinued and started on irbesartan.  Type 2 diabetes: Hold metformin and placed on SSI.  Fatty liver with hypoechoic lesions: Ultrasound with hypoechoic lesions, not clearly simple cyst.  Recommend outpatient workup with dedicated CT or MRI.  Reported history of syphilis: RPR pending.  HIV antibody nonreactive.   DVT prophylaxis: enoxaparin (LOVENOX) injection 40 mg Start: 09/11/22 1915 Code Status: Full code Family Communication: Discussed with patient, he has discussed with family Disposition Plan: From home and likely discharge to home pending clinical progress Consults called: Cardiology Severity of Illness: The appropriate patient status for this patient is OBSERVATION. Observation status is judged to be reasonable and necessary in order to provide the required intensity of service to ensure the patient's safety. The patient's presenting symptoms, physical exam findings, and initial radiographic and laboratory data in the context of their medical condition is felt to place them at decreased risk for further clinical deterioration. Furthermore, it is anticipated that the patient will be medically  stable for discharge from the hospital within 2 midnights of admission.   Darreld Mclean MD [facility]  If 7PM-7AM, please contact night-coverage [email or web address]  09/11/2022, 7:24 PM

## 2022-09-11 NOTE — ED Triage Notes (Signed)
Pt to ED via EMS from PCP. Pt developed CP while at PCP. Pt describes sharp, central chest pain. Pt states he has had the same episodes in the past and was told it was muscular pain. Pt denies CP upon arrival to ED. Pt does c/o intermittent SOB and intermittent dizziness. Pt denies N/V. PCP called EMS d/t pt being bradycardia at 49 with PCP, but has not dropped below HR of 50 with EMS. Pt reports taking metoprolol at home.   EMS: 55 HR 148/94 99% RA 324 ASA

## 2022-09-11 NOTE — ED Notes (Signed)
ED TO INPATIENT HANDOFF REPORT  ED Nurse Name and Phone #: [phone]  S Name/Age/Gender Nicholas Case 62 y.o. male Room/Bed: RESUSC/RESUSC  Code Status   Code Status: Not on file  Home/SNF/Other Home Patient oriented to: self, place, time, and situation Is this baseline? Yes   Triage Complete: Triage complete  Chief Complaint Chest pain [R07.9]  Triage Note Pt to ED via EMS from PCP. Pt developed CP while at PCP. Pt describes sharp, central chest pain. Pt states he has had the same episodes in the past and was told it was muscular pain. Pt denies CP upon arrival to ED. Pt does c/o intermittent SOB and intermittent dizziness. Pt denies N/V. PCP called EMS d/t pt being bradycardia at 49 with PCP, but has not dropped below HR of 50 with EMS. Pt reports taking metoprolol at home.   EMS: 55 HR 148/94 99% RA 324 ASA   Allergies Allergies  Allergen Reactions   Ace Inhibitors Cough    Level of Care/Admitting Diagnosis ED Disposition     ED Disposition  Admit   Condition  --   Comment  Hospital Area: [facility] [[id number]]  Level of Care: Telemetry Cardiac [103]  May place patient in observation at [facility] or [facility] if equivalent level of care is available:: No  Covid Evaluation: Asymptomatic - no recent exposure (last 10 days) testing not required  Diagnosis: Chest pain [454098]  Admitting Physician: Charlsie Quest [[id number]]  Attending Physician: Charlsie Quest [[id number]]          B Medical/Surgery History Past Medical History:  Diagnosis Date   DM II (diabetes mellitus, type II), controlled (HCC)    Hypertension    Syphilis    Past Surgical History:  Procedure Laterality Date   NECK SURGERY       A IV Location/Drains/Wounds Patient Lines/Drains/Airways Status     Active Line/Drains/Airways     Name Placement date Placement time Site Days   Peripheral IV 09/11/22 20 G Anterior;Distal;Left;Upper Arm 09/11/22  1232  Arm  less  than 1   Peripheral IV 09/11/22 18 G 1.88" Right;Medial Antecubital 09/11/22  1641  Antecubital  less than 1            Intake/Output Last 24 hours No intake or output data in the 24 hours ending 09/11/22 1857  Labs/Imaging Results for orders placed or performed during the hospital encounter of 09/11/22 (from the past 48 hour(s))  CBC with Differential     Status: None   Collection Time: 09/11/22 12:30 PM  Result Value Ref Range   WBC 5.0 4.0 - 10.5 K/uL   RBC 4.88 4.22 - 5.81 MIL/uL   Hemoglobin 13.7 13.0 - 17.0 g/dL   HCT 08.6 57.8 - 46.9 %   MCV 86.5 80.0 - 100.0 fL   MCH 28.1 26.0 - 34.0 pg   MCHC 32.5 30.0 - 36.0 g/dL   RDW 62.9 52.8 - 41.3 %   Platelets 216 150 - 400 K/uL   nRBC 0.0 0.0 - 0.2 %   Neutrophils Relative % 60 %   Neutro Abs 3.0 1.7 - 7.7 K/uL   Lymphocytes Relative 31 %   Lymphs Abs 1.5 0.7 - 4.0 K/uL   Monocytes Relative 6 %   Monocytes Absolute 0.3 0.1 - 1.0 K/uL   Eosinophils Relative 2 %   Eosinophils Absolute 0.1 0.0 - 0.5 K/uL   Basophils Relative 1 %   Basophils Absolute 0.0 0.0 - 0.1  K/uL   Immature Granulocytes 0 %   Abs Immature Granulocytes 0.02 0.00 - 0.07 K/uL    Comment: Performed at [facility], [address], [place], [place] [address]  Comprehensive metabolic panel     Status: Abnormal   Collection Time: 09/11/22 12:30 PM  Result Value Ref Range   Sodium 139 135 - 145 mmol/L   Potassium 3.7 3.5 - 5.1 mmol/L   Chloride 103 98 - 111 mmol/L   CO2 28 22 - 32 mmol/L   Glucose, Bld 116 (H) 70 - 99 mg/dL    Comment: Glucose reference range applies only to samples taken after fasting for at least 8 hours.   BUN 11 6 - 20 mg/dL   Creatinine, Ser 8.41 0.61 - 1.24 mg/dL   Calcium 9.0 8.9 - 32.4 mg/dL   Total Protein 7.9 6.5 - 8.1 g/dL   Albumin 3.7 3.5 - 5.0 g/dL   AST 21 15 - 41 U/L   ALT 19 0 - 44 U/L   Alkaline Phosphatase 93 38 - 126 U/L   Total Bilirubin 0.3 0.3 - 1.2 mg/dL   GFR, Estimated >40 >10 mL/min    Comment:  (NOTE) Calculated using the CKD-EPI Creatinine Equation (2021)    Anion gap 8 5 - 15    Comment: Performed at [facility], [address] [place], [place] [address]  Troponin I (High Sensitivity)     Status: None   Collection Time: 09/11/22 12:30 PM  Result Value Ref Range   Troponin I (High Sensitivity) 8 <18 ng/L    Comment: (NOTE) Elevated high sensitivity troponin I (hsTnI) values and significant  changes across serial measurements may suggest ACS but many other  chronic and acute conditions are known to elevate hsTnI results.  Refer to the "Links" section for chest pain algorithms and additional  guidance. Performed at [facility], [address] [place], [place] [address]   Brain natriuretic peptide     Status: None   Collection Time: 09/11/22  2:09 PM  Result Value Ref Range   B Natriuretic Peptide 19.7 0.0 - 100.0 pg/mL    Comment: Performed at [facility], [address], [place], [place] [address]  Troponin I (High Sensitivity)     Status: None   Collection Time: 09/11/22  2:16 PM  Result Value Ref Range   Troponin I (High Sensitivity) 6 <18 ng/L    Comment: (NOTE) Elevated high sensitivity troponin I (hsTnI) values and significant  changes across serial measurements may suggest ACS but many other  chronic and acute conditions are known to elevate hsTnI results.  Refer to the "Links" section for chest pain algorithms and additional  guidance. Performed at [facility], [address] [place], [place] [address]   Lipase, blood     Status: None   Collection Time: 09/11/22  2:16 PM  Result Value Ref Range   Lipase 34 11 - 51 U/L    Comment: Performed at [facility], [address] [place], [place] [address]  TSH     Status: None   Collection Time: 09/11/22  2:33 PM  Result Value Ref Range   TSH 1.013 0.350 - 4.500 uIU/mL    Comment: Performed by a 3rd Generation assay with a functional sensitivity of <=0.01 uIU/mL. Performed  at [facility], [address] [place], [place] [address]    US Abdomen Limited RUQ (LIVER/GB)  Result Date:  09/11/2022 CLINICAL DATA:  Abdominal pain EXAM: ULTRASOUND ABDOMEN LIMITED RIGHT UPPER QUADRANT COMPARISON:  None Available. FINDINGS: Gallbladder: No gallstones or wall thickening visualized. No sonographic Murphy sign noted by sonographer. Common bile duct: Diameter: 4 mm Liver: Diffusely echogenic hepatic parenchyma consistent with fatty liver infiltration. There is some hypoechoic areas identified in the liver such as in the right hepatic lobe centrally measuring 20 mm which has some internal echoes. Not clearly a simple cyst. Smaller focus more peripherally in the liver measuring 8 mm. Portal vein is patent on color Doppler imaging with normal direction of blood flow towards the liver. Other: None. IMPRESSION: No gallstones or ductal dilatation. Fatty liver infiltration with some hypoechoic lesions. Not clearly simple cysts. Recommend dedicated workup when appropriate with either dynamic CT or MRI when clinically appropriate. Electronically Signed   By: Karen Kays M.D.   On: 09/11/2022 17:18   DG Chest Portable 1 View  Result Date: 09/11/2022 CLINICAL DATA:  Chest pain EXAM: PORTABLE CHEST 1 VIEW COMPARISON:  03/11/2006 x-ray FINDINGS: Borderline size heart. No consolidation, pneumothorax or effusion. No edema. Overlapping cardiac leads. IMPRESSION: Borderline size heart.  No acute cardiopulmonary disease. Electronically Signed   By: Karen Kays M.D.   On: 09/11/2022 14:47    Pending Labs Unresulted Labs (From admission, onward)     Start     Ordered   09/11/22 1601  RPR  Once,   URGENT        09/11/22 1608   09/11/22 1601  HIV Antibody (routine testing w rflx)  (HIV Antibody (Routine testing w reflex) panel)  Once,   URGENT        09/11/22 1608            Vitals/Pain Today's Vitals   09/11/22 1615 09/11/22 1630 09/11/22 1819 09/11/22 1830  BP: (!) 155/96 (!) 155/96   (!) 187/163  Pulse: (!) 49 (!) 50 (!) 51 74  Resp: 11 15 13 17   Temp:   98.1 F (36.7 C)   TempSrc:   Oral   SpO2: 100% 99% 100% 99%  PainSc:        Isolation Precautions No active isolations  Medications Medications  aspirin EC tablet 81 mg (has no administration in time range)  amLODipine (NORVASC) tablet 5 mg (has no administration in time range)  hydrochlorothiazide (HYDRODIURIL) tablet 25 mg (has no administration in time range)  irbesartan (AVAPRO) tablet 150 mg (has no administration in time range)    Mobility walks     Focused Assessments Cardiac Assessment Handoff:  Cardiac Rhythm: Sinus bradycardia No results found for: "CKTOTAL", "CKMB", "CKMBINDEX", "TROPONINI" No results found for: "DDIMER" Does the Patient currently have chest pain? No   , Neuro Assessment Handoff:  Swallow screen pass? Yes  Cardiac Rhythm: Sinus bradycardia       Neuro Assessment:   Neuro Checks:      Has TPA been given? No If patient is a Neuro Trauma and patient is going to OR before floor call report to 4N Charge nurse: [phone] or [phone]   R Recommendations: See Admitting Provider Note  Report given to:   Additional Notes: PT on room air and able to use urinal

## 2022-09-11 NOTE — Hospital Course (Signed)
Nicholas Case is a 62 y.o. male with medical history significant for T2DM, HTN who is admitted for chest pain evaluation.

## 2022-09-11 NOTE — ED Provider Notes (Signed)
Care assumed from previous provider at shift change.  See note for HPI.  In summation patient with intermittent chest pain, shortness of breath and dizziness.  Bradycardic here.  He is on beta-blocker.  Cards to see for disposition.  Per previous provider, do not think this was a neurologic source as far as his dizziness was concerned. Physical Exam  BP (!) 155/96   Pulse (!) 50   Temp 98 F (36.7 C) (Oral)   Resp 15   SpO2 99%   Physical Exam Vitals and nursing note reviewed.  Constitutional:      General: He is not in acute distress.    Appearance: He is well-developed. He is not ill-appearing or diaphoretic.  HENT:     Head: Atraumatic.  Eyes:     Pupils: Pupils are equal, round, and reactive to light.  Cardiovascular:     Rate and Rhythm: Normal rate and regular rhythm.  Pulmonary:     Effort: Pulmonary effort is normal. No respiratory distress.  Abdominal:     General: Bowel sounds are normal. There is no distension.     Palpations: Abdomen is soft.     Tenderness: There is no guarding or rebound.  Musculoskeletal:        General: Normal range of motion.     Cervical back: Normal range of motion and neck supple.  Skin:    General: Skin is warm and dry.  Neurological:     General: No focal deficit present.     Mental Status: He is alert and oriented to person, place, and time.     Procedures  Procedures Labs Reviewed  COMPREHENSIVE METABOLIC PANEL - Abnormal; Notable for the following components:      Result Value   Glucose, Bld 116 (*)    All other components within normal limits  CBC WITH DIFFERENTIAL/PLATELET  BRAIN NATRIURETIC PEPTIDE  LIPASE, BLOOD  TSH  RPR  HIV ANTIBODY (ROUTINE TESTING W REFLEX)  TROPONIN I (HIGH SENSITIVITY)  TROPONIN I (HIGH SENSITIVITY)   US Abdomen Limited RUQ (LIVER/GB)  Result Date: 09/11/2022 CLINICAL DATA:  Abdominal pain EXAM: ULTRASOUND ABDOMEN LIMITED RIGHT UPPER QUADRANT COMPARISON:  None Available. FINDINGS: Gallbladder:  No gallstones or wall thickening visualized. No sonographic Murphy sign noted by sonographer. Common bile duct: Diameter: 4 mm Liver: Diffusely echogenic hepatic parenchyma consistent with fatty liver infiltration. There is some hypoechoic areas identified in the liver such as in the right hepatic lobe centrally measuring 20 mm which has some internal echoes. Not clearly a simple cyst. Smaller focus more peripherally in the liver measuring 8 mm. Portal vein is patent on color Doppler imaging with normal direction of blood flow towards the liver. Other: None. IMPRESSION: No gallstones or ductal dilatation. Fatty liver infiltration with some hypoechoic lesions. Not clearly simple cysts. Recommend dedicated workup when appropriate with either dynamic CT or MRI when clinically appropriate. Electronically Signed   By: Karen Kays M.D.   On: 09/11/2022 17:18   DG Chest Portable 1 View  Result Date: 09/11/2022 CLINICAL DATA:  Chest pain EXAM: PORTABLE CHEST 1 VIEW COMPARISON:  03/11/2006 x-ray FINDINGS: Borderline size heart. No consolidation, pneumothorax or effusion. No edema. Overlapping cardiac leads. IMPRESSION: Borderline size heart.  No acute cardiopulmonary disease. Electronically Signed   By: Karen Kays M.D.   On: 09/11/2022 14:47    ED Course / MDM   Clinical Course as of 09/11/22 1744  Mon Sep 11, 2022  1505 CP, SOB, Dizzy, bradycardia. Cards to see  and Set dispo. Does not need head imaging [BH]  1608 Per cardiology PA patient complaining for right upper quadrant abdominal pain as well as possible syphilis untreated as well as possible HIV which she was supposed to follow with the health department for her.  Will add on HIV, RPR.  Will also obtain ultrasound right upper quadrant. Per Cards, admit to medicine and Cards will Follow [BH]    Clinical Course User Index [BH] Belina Mandile A, [name]    Care assumed from previous provider at shift change.  See note for HPI.  In summation patient with  intermittent chest pain, shortness of breath and dizziness.  Bradycardic here.  He is on beta-blocker.  Cards to see for disposition.  Peers provided do not think this was a neurologic source his dizziness was concerned.  According cardiology PA patient was also complaining about some right upper quadrant abdominal pain as well as possible untreated syphilis.  Denies any pain with myself.  Right upper quadrant ultrasound without significant etiology he is currently asymptomatic as far as pain is concerned.  Cardiology requested medicine admission they will follow in consult.  Discussed with hospitalist Dr. Allena Katz, Who will evaluate patient for admission  The patient appears reasonably stabilized for admission considering the current resources, flow, and capabilities available in the ED at this time, and I doubt any other [facility] requiring further screening and/or treatment in the ED prior to admission.    Medical Decision Making Amount and/or Complexity of Data Reviewed External Data Reviewed: labs, radiology, ECG and notes. Labs: ordered. Decision-making details documented in ED Course. Radiology: ordered. ECG/medicine tests: ordered and independent interpretation performed. Decision-making details documented in ED Course.  Risk OTC drugs. Prescription drug management. Parenteral controlled substances. Decision regarding hospitalization. Diagnosis or treatment significantly limited by social determinants of health.          Briah Nary A, [name] 09/11/22 1855    Rexford Maus, DO 09/11/22 2351

## 2022-09-12 ENCOUNTER — Other Ambulatory Visit (HOSPITAL_COMMUNITY): Payer: Self-pay

## 2022-09-12 ENCOUNTER — Observation Stay (HOSPITAL_BASED_OUTPATIENT_CLINIC_OR_DEPARTMENT_OTHER): Payer: No Typology Code available for payment source

## 2022-09-12 DIAGNOSIS — I152 Hypertension secondary to endocrine disorders: Secondary | ICD-10-CM | POA: Diagnosis not present

## 2022-09-12 DIAGNOSIS — R072 Precordial pain: Secondary | ICD-10-CM

## 2022-09-12 DIAGNOSIS — E1159 Type 2 diabetes mellitus with other circulatory complications: Secondary | ICD-10-CM | POA: Diagnosis not present

## 2022-09-12 DIAGNOSIS — R079 Chest pain, unspecified: Secondary | ICD-10-CM

## 2022-09-12 DIAGNOSIS — R001 Bradycardia, unspecified: Secondary | ICD-10-CM

## 2022-09-12 LAB — GLUCOSE, CAPILLARY
Glucose-Capillary: 108 mg/dL — ABNORMAL HIGH (ref 70–99)
Glucose-Capillary: 119 mg/dL — ABNORMAL HIGH (ref 70–99)

## 2022-09-12 MED ORDER — POTASSIUM CHLORIDE CRYS ER 20 MEQ PO TBCR
40.0000 meq | EXTENDED_RELEASE_TABLET | Freq: Once | ORAL | Status: AC
  Administered 2022-09-12: 40 meq via ORAL

## 2022-09-12 MED ORDER — IOHEXOL 350 MG/ML SOLN
100.0000 mL | Freq: Once | INTRAVENOUS | Status: AC | PRN
  Administered 2022-09-12: 100 mL via INTRAVENOUS

## 2022-09-12 MED ORDER — NITROGLYCERIN 0.4 MG SL SUBL
0.8000 mg | SUBLINGUAL_TABLET | Freq: Once | SUBLINGUAL | Status: AC
  Administered 2022-09-12: 0.8 mg via SUBLINGUAL

## 2022-09-12 MED ORDER — ACETAMINOPHEN 325 MG PO TABS: 650.0000 mg | ORAL_TABLET | Freq: Four times a day (QID) | ORAL | Status: AC | PRN

## 2022-09-12 MED ORDER — NITROGLYCERIN 0.4 MG SL SUBL
SUBLINGUAL_TABLET | SUBLINGUAL | Status: AC
  Filled 2022-09-12: qty 2

## 2022-09-12 MED ORDER — IRBESARTAN 150 MG PO TABS
150.0000 mg | ORAL_TABLET | Freq: Every day | ORAL | 0 refills | Status: AC
  Filled 2022-09-12: qty 90, 90d supply, fill #0

## 2022-09-12 NOTE — Progress Notes (Signed)
   09/12/22   To Whom it may concern,  Coddy Mansir was hospitalized at [facility] from 09/11/2022 through 09/12/2022.  He has been cleared to return to work on but not before 09/18/2022 with no medical restrictions.    Should you have further questions please feel free to contact our office.  Be advised that no medical information will be divulged without a signed HIPA release from the patient.    Sincerely,  Lonia Blood, MD [facility] Office  [phone]

## 2022-09-12 NOTE — Plan of Care (Signed)
  Problem: Education: Goal: Ability to describe self-care measures that may prevent or decrease complications (Diabetes Survival Skills Education) will improve Outcome: Adequate for Discharge   Problem: Education: Goal: Individualized Educational Video(s) Outcome: Adequate for Discharge

## 2022-09-12 NOTE — Progress Notes (Signed)
Echocardiogram 2D Echocardiogram has been performed.  Irish Lack, RDCS 09/12/2022, 8:39 AM

## 2022-09-12 NOTE — TOC Transition Note (Signed)
Transition of Care [facility]) - CM/SW Discharge Note   Patient Details  Name: Nicholas Case MRN: [id number] Date of Birth: 01-19-1961  Transition of Care [facility]) CM/SW Contact:  Leone Haven, RN Phone Number: 09/12/2022, 12:36 PM   Clinical Narrative:    Patient for dc and NCM assisting with Cab voucher.    Final next level of care: Home/Self Care Barriers to Discharge: No Barriers Identified   Patient Goals and CMS Choice   Choice offered to / list presented to : NA  Discharge Placement                         Discharge Plan and Services Additional resources added to the After Visit Summary for   In-house Referral: NA   Post Acute Care Choice: NA            DME Agency: NA       HH Arranged: NA          Social Determinants of Health (SDOH) Interventions SDOH Screenings   Food Insecurity: No Food Insecurity (09/11/2022)  Housing: Low Risk  (09/11/2022)  Transportation Needs: No Transportation Needs (09/11/2022)  Utilities: Not At Risk (09/11/2022)  Financial Resource Strain: Not at Risk (05/01/2022)   Received [facility]  Physical Activity: Not on File (04/10/2022)   Received from [facility]  Social Connections: Not on File (04/10/2022)   Received from [facility]  Stress: Not on File (04/10/2022)   Received from [facility]  Tobacco Use: High Risk (09/11/2022)     Readmission Risk Interventions     No data to display

## 2022-09-12 NOTE — TOC Initial Note (Signed)
Transition of Care [facility]) - Initial/Assessment Note    Patient Details  Name: Nicholas Case MRN: [id number] Date of Birth: April 03, 1960  Transition of Care [facility]) CM/SW Contact:    Leone Haven, RN Phone Number: 09/12/2022, 12:32 PM  Clinical Narrative:                 From home alone, pta self ambulatory, he has PCP, Sherron Flemings, he has insurance , [employer].  He states he does not currently have any HH services or DME at home.  He states he was brought her by ambulance from his PCP office so his car is there and he will need a cab voucher to go back to PCP office to get his car.  NCM assisted him with cab voucher.  He state he has no support system.  TOC to fill his meds.   Expected Discharge Plan: Home/Self Care Barriers to Discharge: No Barriers Identified   Patient Goals and CMS Choice Patient states their goals for this hospitalization and ongoing recovery are:: return home   Choice offered to / list presented to : NA      Expected Discharge Plan and Services In-house Referral: NA   Post Acute Care Choice: NA Living arrangements for the past 2 months: Single Family Home Expected Discharge Date: 09/12/22                 DME Agency: NA       HH Arranged: NA          Prior Living Arrangements/Services Living arrangements for the past 2 months: Single Family Home Lives with:: Self Patient language and need for interpreter reviewed:: Yes Do you feel safe going back to the place where you live?: Yes      Need for Family Participation in Patient Care: No (Comment) Care giver support system in place?: No (comment)   Criminal Activity/Legal Involvement Pertinent to Current Situation/Hospitalization: No - Comment as needed  Activities of Daily Living Home Assistive Devices/Equipment: None ADL Screening (condition at time of admission) Patient's cognitive ability adequate to safely complete daily activities?: Yes Is the patient deaf or have difficulty hearing?:  No Does the patient have difficulty seeing, even when wearing glasses/contacts?: No Does the patient have difficulty concentrating, remembering, or making decisions?: No Patient able to express need for assistance with ADLs?: Yes Does the patient have difficulty dressing or bathing?: No Independently performs ADLs?: Yes (appropriate for developmental age) Does the patient have difficulty walking or climbing stairs?: No Weakness of Legs: None Weakness of Arms/Hands: None  Permission Sought/Granted                  Emotional Assessment   Attitude/Demeanor/Rapport: Engaged Affect (typically observed): Appropriate Orientation: : Oriented to Self, Oriented to Place, Oriented to  Time, Oriented to Situation Alcohol / Substance Use: Not Applicable Psych Involvement: No (comment)  Admission diagnosis:  Bradycardia [R00.1] Atypical chest pain [R07.89] Chest pain [R07.9] Patient Active Problem List   Diagnosis Date Noted   Chest pain 09/11/2022   Sinus bradycardia 09/11/2022   Type 2 diabetes mellitus (HCC) 09/11/2022   Liver lesion 09/11/2022   Elevated fasting glucose 05/31/2010   EDEMA 07/02/2009   GERD 12/22/2008   ULNAR NEUROPATHY, ELBOW 12/09/2008   Hypertension associated with diabetes (HCC) 02/03/2008   OBESITY 02/03/2008   PCP:  Loletta Specter, [name] Pharmacy:   [facility] [address], [address] [place] [address] Phone: [phone]  Fax: [phone]  [id number] [place] Otto,  - [address] AT [facility] OF [address] [place] [address] Phone: [phone] Fax: [phone]  [facility] [address] [place] [address] Phone: [phone] Fax: [phone]     Social Determinants of Health (SDOH) Social History: SDOH Screenings   Food Insecurity: No Food Insecurity (09/11/2022)  Housing: Low Risk   (09/11/2022)  Transportation Needs: No Transportation Needs (09/11/2022)  Utilities: Not At Risk (09/11/2022)  Financial Resource Strain: Not at Risk (05/01/2022)   Received from [facility]  Physical Activity: Not on File (04/10/2022)   Received from [facility]  Social Connections: Not on File (04/10/2022)   Received from [facility]  Stress: Not on File (04/10/2022)   [facility] from [facility]  Tobacco Use: High Risk (09/11/2022)   SDOH Interventions:     Readmission Risk Interventions     No data to display

## 2022-09-12 NOTE — Discharge Summary (Signed)
DISCHARGE SUMMARY  Nicholas Case  MR#: [id number]  DOB:December 28, 1960  Date of Admission: 09/11/2022 Date of Discharge: 09/12/2022  Attending Physician:Areeb Corron Silvestre Gunner, MD  Patient's NWG:[name], [name], [name]  Consults: [facility] Cardiology   Disposition: D/C home   Follow-up Appts:  Follow-up Information     Loletta Specter, [name] Follow up in 7 day(s).   Specialty: Physician Assistant Contact information: [address] [place] [address] [phone]                 Tests Needing Follow-up: -assure patient follows through with syphillis treatment as per Health Department  -assess HR off BB  -CT or MRI of the abdom is suggested as an outpt to evaluate the patient's fattly liver (noted on RUQ abdom US)  Discharge Diagnoses: Chest pain - non-cardiac Sinus bradycardia Mild hypokalemia Fatty infiltrate of the liver HTN DM2 Syphilis Obesity - Body mass index is 33.73 kg/m.  Initial presentation: 62 year old with a history of DM2 and HTN who presented to his PCP 09/11/2022 for a regular checkup at which time he was found to be bradycardic and mildly dizzy and sent to the ER for evaluation.  On further questioning he reported intermittent sharp chest wall pain.  In the ER heart rate was 56.  CXR was without acute findings.  RUQ US revealed no gallstones or ductal dilatation but did report fatty infiltrate of the liver.  Cardiology saw the patient in consultation and ordered TTE as well as coronary CTa.   Hospital Course:  Chest pain Intermittent and actually chronic by history, with increasing frequency and duration of late - TTE without evidence of focal WMA and w/ preserved EF - Cardiac CT with no obstructive coronary disease noted - BB stopped due to brady - pt counseled on need to control DM, HLD, and HTN to avoid progressive CAD - cleared for d/c by Cardiology    Sinus bradycardia Likely related to beta-blocker which has been discontinued -TSH normal at  1.013   Mild hypokalemia Supplemented    Fatty infiltrate of the liver The Radiologist has recommended further evaluation with CT or MRI when clinically stable - this can be accomplished as an outpt - likely a consequence of his obesity    HTN Controlled during this admission   DM2 CBG presently well-controlled - A1c 6.6 - pt advised to continue strict control    Syphilis Patient reported that he was recently diagnosed with syphilis but he has not presented to the health department for follow up - extreme importance of following through with treatment explained to the patient, to include risk of long term health consequences if this goes untreated - pt vowed he would follow up with the Health Dept   Obesity - Body mass index is 33.73 kg/m. Counseled on need for weight loss, and risk factor control   Allergies as of 09/12/2022       Reactions   Ace Inhibitors Cough        Medication List     STOP taking these medications    metoprolol succinate 25 MG 24 hr tablet Commonly known as: TOPROL-XL       TAKE these medications    acetaminophen 325 MG tablet Commonly known as: TYLENOL Take 2 tablets (650 mg total) by mouth every 6 (six) hours as needed for mild pain (or Fever >/= 101).   amLODipine 5 MG tablet Commonly known as: NORVASC Take 1 tablet (5 mg total) by mouth daily.   aspirin  EC 81 MG tablet Take 1 tablet (81 mg total) by mouth daily.   ELDERBERRY PO Take 1 capsule by mouth daily as needed (immune support).   ergocalciferol 1.25 MG (50000 UT) capsule Commonly known as: VITAMIN D2 Take 50,000 Units by mouth once a week.   hydrochlorothiazide 25 MG tablet Commonly known as: HYDRODIURIL Take 1 tablet (25 mg total) by mouth daily.   irbesartan 150 MG tablet Commonly known as: AVAPRO Take 1 tablet (150 mg total) by mouth daily. Start taking on: September 13, 2022        Day of Discharge BP (!) 132/91 (BP Location: Left Arm)   Pulse (!) 55   Temp 98  F (36.7 C) (Oral)   Resp 17   Ht 5\' 9"  (1.753 m)   Wt 103.6 kg   SpO2 99%   BMI 33.73 kg/m   Physical Exam: General: No acute respiratory distress Lungs: Clear to auscultation bilaterally without wheezes or crackles Cardiovascular: Regular rate and rhythm without murmur gallop or rub normal S1 and S2 Abdomen: Nontender, nondistended, soft, bowel sounds positive, no rebound, no ascites, no appreciable mass Extremities: No significant cyanosis, clubbing, or edema bilateral lower extremities  Basic Metabolic Panel: Recent Labs  Lab 09/11/22 1230 09/12/22 0119  NA 139 138  K 3.7 3.2*  CL 103 100  CO2 28 28  GLUCOSE 116* 97  BUN 11 13  CREATININE 1.04 1.12  CALCIUM 9.0 8.8*    CBC: Recent Labs  Lab 09/11/22 1230 09/12/22 0119  WBC 5.0 7.5  NEUTROABS 3.0  --   HGB 13.7 13.5  HCT 42.2 41.5  MCV 86.5 86.3  PLT 216 226    Time spent in discharge (includes decision making & examination of pt): 30 minutes  09/12/2022, 12:13 PM   Lonia Blood, MD [facility] Office  [phone]

## 2022-09-12 NOTE — Progress Notes (Signed)
   Progress Note  Patient Name: Nicholas Case Date of Encounter: 09/12/2022 Primary Cardiologist: New to Dr. Allyson Sabal  Subjective   Overnight BP still elevated. Echo and CT are pending No CP, SOB, Palpitations.  Worried about MI.  Worried about heart rate  Vital Signs    Vitals:   09/11/22 2000 09/11/22 2030 09/12/22 0008 09/12/22 0404  BP: (!) 146/90 (!) 151/92 (!) 151/94 (!) 143/92  Pulse: (!) 59 69 (!) 55 (!) 50  Resp: 11 14 17 17   Temp:  98.3 F (36.8 C) 98.5 F (36.9 C) 98.5 F (36.9 C)  TempSrc:  Oral Oral Oral  SpO2: 100% 98% 97% 98%  Weight:  103.6 kg    Height:  5\' 9"  (1.753 m)      Intake/Output Summary (Last 24 hours) at 09/12/2022 0827 Last data filed at 09/12/2022 0406 Gross per 24 hour  Intake 3 ml  Output 1450 ml  Net -1447 ml   Filed Weights   09/11/22 2030  Weight: 103.6 kg    Physical Exam   GEN: No acute distress.   Neck: No JVD Cardiac: Regular bradycardia, no murmurs, rubs, or gallops.  Respiratory: Clear to auscultation bilaterally. GI: Soft, nontender, non-distended; no RUQ tenderness on exam MS: No edema  Labs  EKG: Sinus bradycardia, LVH with Lateral TWI vs secondary repolarization Telemetry: Sinus bradycardia   Chemistry Recent Labs  Lab 09/11/22 1230 09/12/22 0119  NA 139 138  K 3.7 3.2*  CL 103 100  CO2 28 28  GLUCOSE 116* 97  BUN 11 13  CREATININE 1.04 1.12  CALCIUM 9.0 8.8*  PROT 7.9  --   ALBUMIN 3.7  --   AST 21  --   ALT 19  --   ALKPHOS 93  --   BILITOT 0.3  --   GFRNONAA >60 >60  ANIONGAP 8 10     Hematology Recent Labs  Lab 09/11/22 1230 09/12/22 0119  WBC 5.0 7.5  RBC 4.88 4.81  HGB 13.7 13.5  HCT 42.2 41.5  MCV 86.5 86.3  MCH 28.1 28.1  MCHC 32.5 32.5  RDW 13.9 13.8  PLT 216 226    Cardiac EnzymesNo results for input(s): "TROPONINI" in the last 168 hours. No results for input(s): "TROPIPOC" in the last 168 hours.   BNP Recent Labs  Lab 09/11/22 1409  BNP 19.7     DDimer No results for  input(s): "DDIMER" in the last 168 hours.    Assessment & Plan   Precordial CP - CCTA pending for today  HTN with DM - BB stopped for query of symptomatic bradycardia - continue irebsartan 150 mg - hx of ACEi cough    LVH - echo pending  Marijuana use - discussed cessation   If non obstructive disease and grossly normal echo; from a cardiology perspective could be discussed on ARB, off BB, with BP and BMP follow up  As per primary - syphilis - RUQ pain     For questions or updates, please contact [facility] Please consult [email or web address] for contact info under Cardiology/STEMI.      Riley Lam, MD [facility] Cardiologist [facility]  [address] [place], [address] [place], [place] [address] [phone]  8:27 AM

## 2022-09-13 ENCOUNTER — Telehealth: Payer: Self-pay

## 2022-09-13 NOTE — Transitions of Care (Post Inpatient/ED Visit) (Signed)
   09/13/2022  Name: Nicholas Case MRN: [id number] DOB: April 21, 1960  Today's TOC FU Call Status: Today's TOC FU Call Status:: Successful TOC FU Call Completed TOC FU Call Complete Date: 09/13/22  Transition Care Management Follow-up Telephone Call Date of Discharge: 09/12/22 Discharge Facility: [facility]) Type of Discharge: Inpatient Admission Primary Inpatient Discharge Diagnosis:: chest pain  PCP on AVS is listed as Sindy Messing, PA who was at [facility]; but is no longer there.  The patient asked why I was calling before he would confirm his birthday and last name.  He then said he doesn't know a Sindy Messing and he already has a doctor that he sees and is fine with. Nothing further discussed.       SIGNATURE Robyne Peers, RN

## 2022-12-11 ENCOUNTER — Ambulatory Visit
Payer: No Typology Code available for payment source | Attending: Cardiovascular Disease | Admitting: Cardiovascular Disease

## 2022-12-11 NOTE — Progress Notes (Deleted)
No chief complaint on file.  History of Present Illness: 62 yo male with history of DM, HTN, CAD and syphilis who is here today for follow up. He was seen in the hospital in August 2024 by Dr. Allyson Sabal for evaluation of chest pain. He had sinus bradycardia during the admission and reported chest pain. Echo August 2024 with LVEF=60-65%. No valve disease. Coronary CTA August 2024 with minimal plaque in the RCA and Circumflex. He was diagnosed with syphilis in the summer of 2024.   He tells me today that he    Primary Care Physician: No primary care provider on file.   Past Medical History:  Diagnosis Date   Chest pain 09/11/2022   DM II (diabetes mellitus, type II), controlled (HCC)    EDEMA 07/02/2009   Qualifier: Diagnosis of   By: Burnadette Pop  MD, Trisha Mangle         Elevated fasting glucose 05/31/2010   Hypertension    Hypertension associated with diabetes (HCC) 02/03/2008   Qualifier: Diagnosis of   By: Burnadette Pop  MD, Trisha Mangle         Sinus bradycardia 09/11/2022   Syphilis    Type 2 diabetes mellitus (HCC) 09/11/2022    Past Surgical History:  Procedure Laterality Date   NECK SURGERY      Current Outpatient Medications  Medication Sig Dispense Refill   acetaminophen (TYLENOL) 325 MG tablet Take 2 tablets (650 mg total) by mouth every 6 (six) hours as needed for mild pain (or Fever >/= 101).     amLODipine (NORVASC) 5 MG tablet Take 1 tablet (5 mg total) by mouth daily. 30 tablet 5   aspirin 81 MG EC tablet Take 1 tablet (81 mg total) by mouth daily. 90 tablet 3   ELDERBERRY PO Take 1 capsule by mouth daily as needed (immune support).     ergocalciferol (VITAMIN D2) 1.25 MG (50000 UT) capsule Take 50,000 Units by mouth once a week.     hydrochlorothiazide (HYDRODIURIL) 25 MG tablet Take 1 tablet (25 mg total) by mouth daily. 30 tablet 5   irbesartan (AVAPRO) 150 MG tablet Take 1 tablet (150 mg total) by mouth daily. 90 tablet 0   No current facility-administered medications for  this visit.    Allergies  Allergen Reactions   Ace Inhibitors Cough    Social History   Socioeconomic History   Marital status: Single    Spouse name: Not on file   Number of children: Not on file   Years of education: Not on file   Highest education level: Not on file  Occupational History   Not on file  Tobacco Use   Smoking status: Never   Smokeless tobacco: Current  Substance and Sexual Activity   Alcohol use: Yes    Comment: rare use of beers   Drug use: Yes    Types: Marijuana   Sexual activity: Not on file  Other Topics Concern   Not on file  Social History Narrative   Not on file   Social Determinants of Health   Financial Resource Strain: Not at Risk (05/01/2022)   Received from [facility]    Financial Resource Strain: 1  Food Insecurity: No Food Insecurity (09/11/2022)   Hunger Vital Sign    Worried About Running Out of Food in the Last Year: Never true    Ran Out of Food in the Last Year: Never true  Transportation Needs: No Transportation Needs (09/11/2022)   PRAPARE -  Administrator, Civil Service (Medical): No    Lack of Transportation (Non-Medical): No  Physical Activity: Not on File (04/10/2022)   Received from [facility]   Physical Activity    Physical Activity: 0  Stress: Not on File (04/10/2022)   Received from [facility]   Stress    Stress: 0  Social Connections: Not on File (10/10/2022)   Received from [facility]   Social Connections    Connectedness: 0  Intimate Partner Violence: Not At Risk (09/11/2022)   Humiliation, Afraid, Rape, and Kick questionnaire    Fear of Current or Ex-Partner: No    Emotionally Abused: No    Physically Abused: No    Sexually Abused: No    Family History  Problem Relation Age of Onset   Hypertension Father    Heart failure Father     Review of Systems:  As stated in the HPI and otherwise negative.   There were no vitals taken for this visit.  Physical Examination: General: Well  developed, well nourished, NAD  HEENT: OP clear, mucus membranes moist  SKIN: warm, dry. No rashes. Neuro: No focal deficits  Musculoskeletal: Muscle strength 5/5 all ext  Psychiatric: Mood and affect normal  Neck: No JVD, no carotid bruits, no thyromegaly, no lymphadenopathy.  Lungs:Clear bilaterally, no wheezes, rhonci, crackles Cardiovascular: Regular rate and rhythm. No murmurs, gallops or rubs. Abdomen:Soft. Bowel sounds present. Non-tender.  Extremities: No lower extremity edema. Pulses are 2 + in the bilateral DP/PT.  EKG:  EKG {ACTION; IS/IS [id number]} ordered today. The ekg ordered today demonstrates ***  Recent Labs: 09/11/2022: ALT 19; B Natriuretic Peptide 19.7; TSH 1.013 09/12/2022: BUN 13; Creatinine, Ser 1.12; Hemoglobin 13.5; Platelets 226; Potassium 3.2; Sodium 138   Lipid Panel    Component Value Date/Time   CHOL 189 09/11/2017 0939   TRIG 64 09/11/2017 0939   HDL 45 09/11/2017 0939   CHOLHDL 4.2 09/11/2017 0939   CHOLHDL 4.8 06/15/2010 1436   VLDL 32 06/15/2010 1436   LDLCALC 131 (H) 09/11/2017 0939     Wt Readings from Last 3 Encounters:  09/11/22 103.6 kg  10/11/17 100.8 kg  09/11/17 103.6 kg      Assessment and Plan:   1.   Labs/ tests ordered today include:  No orders of the defined types were placed in this encounter.    Disposition:   F/U with me in ***    Signed, Verne Carrow, MD, [facility] 12/11/2022 6:10 AM    [facility] [address] [place], [place], [place]  [address] Phone: [phone]; Fax: [phone]

## 2022-12-12 ENCOUNTER — Encounter: Payer: Self-pay | Admitting: Cardiovascular Disease

## 2023-03-30 ENCOUNTER — Other Ambulatory Visit: Payer: Self-pay | Admitting: Family Medicine

## 2023-03-30 DIAGNOSIS — K760 Fatty (change of) liver, not elsewhere classified: Secondary | ICD-10-CM

## 2023-03-30 DIAGNOSIS — R1011 Right upper quadrant pain: Secondary | ICD-10-CM
